# Patient Record
Sex: Male | Born: 1955 | Race: White | Hispanic: No | Marital: Married | Smoking: Former smoker
Health system: Southern US, Community
[De-identification: ages and names within clinical notes are randomized; demographics above are authoritative.]

## PROBLEM LIST (undated history)

## (undated) DIAGNOSIS — I10 Essential (primary) hypertension: Secondary | ICD-10-CM

## (undated) DIAGNOSIS — G252 Other specified forms of tremor: Secondary | ICD-10-CM

## (undated) DIAGNOSIS — Z9889 Other specified postprocedural states: Secondary | ICD-10-CM

## (undated) DIAGNOSIS — R112 Nausea with vomiting, unspecified: Secondary | ICD-10-CM

## (undated) DIAGNOSIS — G250 Essential tremor: Secondary | ICD-10-CM

## (undated) DIAGNOSIS — N433 Hydrocele, unspecified: Secondary | ICD-10-CM

## (undated) DIAGNOSIS — E785 Hyperlipidemia, unspecified: Secondary | ICD-10-CM

## (undated) DIAGNOSIS — C801 Malignant (primary) neoplasm, unspecified: Secondary | ICD-10-CM

## (undated) DIAGNOSIS — Z8719 Personal history of other diseases of the digestive system: Secondary | ICD-10-CM

## (undated) DIAGNOSIS — M1990 Unspecified osteoarthritis, unspecified site: Secondary | ICD-10-CM

## (undated) HISTORY — PX: HERNIA REPAIR: SHX51

## (undated) HISTORY — DX: Essential (primary) hypertension: I10

## (undated) HISTORY — DX: Hyperlipidemia, unspecified: E78.5

## (undated) HISTORY — PX: COLONOSCOPY: SHX174

## (undated) HISTORY — PX: TONSILLECTOMY: SUR1361

## (undated) HISTORY — DX: Essential tremor: G25.0

## (undated) HISTORY — DX: Other specified forms of tremor: G25.2

## (undated) HISTORY — PX: MOHS SURGERY: SUR867

---

## 1991-03-31 HISTORY — PX: INGUINAL HERNIA REPAIR: SHX194

## 2001-03-30 HISTORY — PX: INGUINAL HERNIA REPAIR: SHX194

## 2010-03-30 HISTORY — PX: SKIN CANCER EXCISION: SHX779

## 2011-04-10 ENCOUNTER — Other Ambulatory Visit (HOSPITAL_BASED_OUTPATIENT_CLINIC_OR_DEPARTMENT_OTHER): Payer: Self-pay | Admitting: Family Medicine

## 2011-04-10 DIAGNOSIS — R29898 Other symptoms and signs involving the musculoskeletal system: Secondary | ICD-10-CM

## 2011-04-11 ENCOUNTER — Ambulatory Visit (HOSPITAL_BASED_OUTPATIENT_CLINIC_OR_DEPARTMENT_OTHER)
Admission: RE | Admit: 2011-04-11 | Discharge: 2011-04-11 | Disposition: A | Discharge status: Home / Self Care | Payer: 59 | Source: Ambulatory Visit | Attending: Family Medicine | Admitting: Family Medicine

## 2011-04-11 DIAGNOSIS — M542 Cervicalgia: Secondary | ICD-10-CM | POA: Insufficient documentation

## 2011-04-11 DIAGNOSIS — M5020 Other cervical disc displacement, unspecified cervical region: Secondary | ICD-10-CM | POA: Insufficient documentation

## 2011-04-11 DIAGNOSIS — R29898 Other symptoms and signs involving the musculoskeletal system: Secondary | ICD-10-CM

## 2011-04-11 DIAGNOSIS — M25519 Pain in unspecified shoulder: Secondary | ICD-10-CM

## 2011-04-11 DIAGNOSIS — M6281 Muscle weakness (generalized): Secondary | ICD-10-CM | POA: Insufficient documentation

## 2011-04-14 ENCOUNTER — Encounter (HOSPITAL_COMMUNITY): Payer: Self-pay

## 2011-04-14 ENCOUNTER — Encounter (HOSPITAL_COMMUNITY)
Admission: RE | Admit: 2011-04-14 | Discharge: 2011-04-14 | Disposition: A | Discharge status: Home / Self Care | Payer: 59 | Source: Ambulatory Visit | Attending: Anesthesiology | Admitting: Anesthesiology

## 2011-04-14 ENCOUNTER — Other Ambulatory Visit: Payer: Self-pay

## 2011-04-14 ENCOUNTER — Encounter (HOSPITAL_COMMUNITY)
Admission: RE | Admit: 2011-04-14 | Discharge: 2011-04-14 | Disposition: A | Discharge status: Home / Self Care | Payer: 59 | Source: Ambulatory Visit | Attending: Neurosurgery | Admitting: Neurosurgery

## 2011-04-14 ENCOUNTER — Encounter (HOSPITAL_COMMUNITY): Payer: Self-pay | Admitting: Pharmacy Technician

## 2011-04-14 ENCOUNTER — Other Ambulatory Visit (HOSPITAL_COMMUNITY): Payer: Self-pay | Admitting: *Deleted

## 2011-04-14 HISTORY — DX: Other specified postprocedural states: Z98.890

## 2011-04-14 HISTORY — DX: Nausea with vomiting, unspecified: R11.2

## 2011-04-14 HISTORY — DX: Other specified postprocedural states: R11.2

## 2011-04-14 HISTORY — DX: Malignant (primary) neoplasm, unspecified: C80.1

## 2011-04-14 LAB — BASIC METABOLIC PANEL
BUN: 16 mg/dL (ref 6–23)
Chloride: 101 mEq/L (ref 96–112)
Creatinine, Ser: 0.9 mg/dL (ref 0.50–1.35)
GFR calc Af Amer: >90 mL/min (ref >90)
Glucose, Bld: 98 mg/dL (ref 70–99)

## 2011-04-14 LAB — CBC
HCT: 42 % (ref 39.0–52.0)
MCH: 28.8 pg (ref 26.0–34.0)
MCV: 84 fL (ref 78.0–100.0)
Platelets: 222 10*3/uL (ref 150–400)
RBC: 5 MIL/uL (ref 4.22–5.81)

## 2011-04-14 MED ORDER — CEFAZOLIN SODIUM 1-5 GM-% IV SOLN
1.0000 g | INTRAVENOUS | Status: AC
  Administered 2011-04-15: 1 g via INTRAVENOUS
  Filled 2011-04-14: qty 50

## 2011-04-14 MED ORDER — DEXAMETHASONE SODIUM PHOSPHATE 10 MG/ML IJ SOLN
10.0000 mg | Freq: Once | INTRAMUSCULAR | Status: AC
  Administered 2011-04-15: 10 mg via INTRAVENOUS
  Filled 2011-04-14: qty 1

## 2011-04-14 NOTE — Consult Note (Signed)
Anesthesia:  Patient is a 56 year old male scheduled for a four level ACDF on 04/15/11.  This PAT appointment was just today.  His history in significant for skin CA, former smoker (quit 30 years ago).  I was asked to see him during his PAT appointment because he reported a recent episode of chest pain.  His PCP is Dr. Joycelyn Rua who referred him to Dr. Wynetta Emery, and according to the patient is aware of the planned procedure. (He was just seen last week, but Dr. Titus Mould office does not have the final note ready to fax.)  He describes a single episode of chest tightness that lasted only seconds when he was on the phone with the insurance company today.  Pain was located mid to left side of chest.  He had no associated symptoms such as palpitations, nausea, diaphoresis, jaw, or arm pain.  He has not had that pain before of since.  He denies SOB and edema.  Just over Christmas break he was walking 4 miles per day at a steady pace.  He denies known history of CAD/MI or DM.  He does not have a history of HTN although his BP was elevated today at 158/98.  It was 136/80 at his last visit with Dr. Daphane Shepherd.  He denies family history of CAD.  Today's EKG showed NSR.  He has never had a stress or echo done.  His heart has a RRR, lungs clear, no LE edema or carotid bruits noted.  His BMI is 25.5.  Preoperative CXR and labs are unremarkable.   I reviewed above with Anesthesiologist Dr. Chaney Malling.  He agrees that chest pain today was somewhat atypical for cardiac etiology.  EKG is WNL, and he has had recent MET of at least 8.  If he has no further episodes, it is anticipated that he can proceed.  His assigned Anesthesiologist will evaluate him on the day of surgery.

## 2011-04-14 NOTE — Pre-Procedure Instructions (Signed)
20 AQUAN KOPE  04/14/2011   Your procedure is scheduled on:  January 16th.    Report to [facility] at 6:30 AM.   Call this number if you have problems the morning of surgery: [phone]   Remember:   Do not eat food:After Midnight.  May have clear liquids: up to 4 Hours before arrival. 2:30  Clear liquids include soda, tea, black coffee, apple or grape juice, broth.  Take these medicines the morning of surgery with A SIP OF WATER: none   Do not wear jewelry, make-up or nail polish.  Do not wear lotions, powders, or perfumes. You may wear deodorant.  Do not shave 48 hours prior to surgery.  Do not bring valuables to the hospital.  Contacts, dentures or bridgework may not be worn into surgery.  Leave suitcase in the car. After surgery it may be brought to your room.  For patients admitted to the hospital, checkout time is 11:00 AM the day of discharge.   Patients discharged the day of surgery will not be allowed to drive home.  Name and phone number of your driver:--  Special Instructions: CHG Shower Use Special Wash: 1/2 bottle night before surgery and 1/2 bottle morning of surgery.   Please read over the following fact sheets that you were given: Pain Booklet, Coughing and Deep Breathing, MRSA Information and Surgical Site Infection Prevention

## 2011-04-15 ENCOUNTER — Encounter (HOSPITAL_COMMUNITY): Payer: Self-pay | Admitting: Vascular Surgery

## 2011-04-15 ENCOUNTER — Encounter (HOSPITAL_COMMUNITY)
Admission: RE | Disposition: A | Discharge status: Home / Self Care | Payer: Self-pay | Source: Ambulatory Visit | Attending: Neurosurgery

## 2011-04-15 ENCOUNTER — Inpatient Hospital Stay (HOSPITAL_COMMUNITY): Payer: 59

## 2011-04-15 ENCOUNTER — Inpatient Hospital Stay (HOSPITAL_COMMUNITY)
Admission: RE | Admit: 2011-04-15 | Discharge: 2011-04-16 | DRG: 473 | Disposition: A | Discharge status: Home / Self Care | Payer: 59 | Source: Ambulatory Visit | Attending: Neurosurgery | Admitting: Neurosurgery

## 2011-04-15 ENCOUNTER — Inpatient Hospital Stay (HOSPITAL_COMMUNITY): Payer: 59 | Admitting: Vascular Surgery

## 2011-04-15 ENCOUNTER — Encounter (HOSPITAL_COMMUNITY): Payer: Self-pay | Admitting: *Deleted

## 2011-04-15 DIAGNOSIS — Z01812 Encounter for preprocedural laboratory examination: Secondary | ICD-10-CM

## 2011-04-15 DIAGNOSIS — M47812 Spondylosis without myelopathy or radiculopathy, cervical region: Principal | ICD-10-CM | POA: Diagnosis present

## 2011-04-15 HISTORY — PX: ANTERIOR CERVICAL DECOMP/DISCECTOMY FUSION: SHX1161

## 2011-04-15 LAB — CBC
MCV: 84 fL (ref 78.0–100.0)
Platelets: 218 10*3/uL (ref 150–400)
RBC: 4.57 MIL/uL (ref 4.22–5.81)
RDW: 13.4 % (ref 11.5–15.5)
WBC: 11.3 10*3/uL — ABNORMAL HIGH (ref 4.0–10.5)

## 2011-04-15 SURGERY — ANTERIOR CERVICAL DECOMPRESSION/DISCECTOMY FUSION 4 LEVELS
Anesthesia: General | Wound class: Clean

## 2011-04-15 MED ORDER — HYDROMORPHONE HCL PF 1 MG/ML IJ SOLN
INTRAMUSCULAR | Status: AC
  Filled 2011-04-15: qty 1

## 2011-04-15 MED ORDER — 0.9 % SODIUM CHLORIDE (POUR BTL) OPTIME
TOPICAL | Status: DC | PRN
  Administered 2011-04-15: 1000 mL

## 2011-04-15 MED ORDER — ONDANSETRON HCL 4 MG/2ML IJ SOLN
INTRAMUSCULAR | Status: DC | PRN
  Administered 2011-04-15: 4 mg via INTRAVENOUS

## 2011-04-15 MED ORDER — HYDROMORPHONE HCL PF 1 MG/ML IJ SOLN
0.5000 mg | INTRAMUSCULAR | Status: AC
  Administered 2011-04-15 (×2): 0.5 mg via INTRAVENOUS

## 2011-04-15 MED ORDER — LACTATED RINGERS IV SOLN
INTRAVENOUS | Status: DC | PRN
  Administered 2011-04-15 (×3): via INTRAVENOUS

## 2011-04-15 MED ORDER — MIDAZOLAM HCL 5 MG/5ML IJ SOLN
INTRAMUSCULAR | Status: DC | PRN
  Administered 2011-04-15: 2 mg via INTRAVENOUS

## 2011-04-15 MED ORDER — GLYCOPYRROLATE 0.2 MG/ML IJ SOLN
INTRAMUSCULAR | Status: DC | PRN
  Administered 2011-04-15: .4 mg via INTRAVENOUS

## 2011-04-15 MED ORDER — CYCLOBENZAPRINE HCL 10 MG PO TABS
ORAL_TABLET | ORAL | Status: AC
  Filled 2011-04-15: qty 1

## 2011-04-15 MED ORDER — HYDROMORPHONE HCL PF 1 MG/ML IJ SOLN: 0.5000 mg | INTRAMUSCULAR | Status: DC | PRN

## 2011-04-15 MED ORDER — POLYVINYL ALCOHOL 1.4 % OP SOLN
1.0000 [drp] | OPHTHALMIC | Status: DC | PRN
  Filled 2011-04-15: qty 15

## 2011-04-15 MED ORDER — FENTANYL CITRATE 0.05 MG/ML IJ SOLN
INTRAMUSCULAR | Status: DC | PRN
  Administered 2011-04-15 (×2): 50 ug via INTRAVENOUS
  Administered 2011-04-15: 150 ug via INTRAVENOUS

## 2011-04-15 MED ORDER — ONDANSETRON HCL 4 MG/2ML IJ SOLN: 4.0000 mg | INTRAMUSCULAR | Status: DC | PRN

## 2011-04-15 MED ORDER — VECURONIUM BROMIDE 10 MG IV SOLR
INTRAVENOUS | Status: DC | PRN
  Administered 2011-04-15 (×2): 2 mg via INTRAVENOUS

## 2011-04-15 MED ORDER — SODIUM CHLORIDE 0.9 % IR SOLN
Status: DC | PRN
  Administered 2011-04-15: 09:00:00

## 2011-04-15 MED ORDER — MENTHOL 3 MG MT LOZG
1.0000 | LOZENGE | OROMUCOSAL | Status: DC | PRN
  Filled 2011-04-15: qty 9

## 2011-04-15 MED ORDER — CYCLOBENZAPRINE HCL 10 MG PO TABS
10.0000 mg | ORAL_TABLET | Freq: Three times a day (TID) | ORAL | Status: DC | PRN
  Administered 2011-04-15 – 2011-04-16 (×3): 10 mg via ORAL
  Filled 2011-04-15 (×2): qty 1

## 2011-04-15 MED ORDER — SODIUM CHLORIDE 0.9 % IV SOLN
INTRAVENOUS | Status: AC
  Filled 2011-04-15: qty 500

## 2011-04-15 MED ORDER — PROPOFOL 10 MG/ML IV EMUL
INTRAVENOUS | Status: DC | PRN
  Administered 2011-04-15: 200 mg via INTRAVENOUS

## 2011-04-15 MED ORDER — CEFAZOLIN SODIUM 1-5 GM-% IV SOLN
1.0000 g | Freq: Three times a day (TID) | INTRAVENOUS | Status: AC
Start: 2011-04-15 — End: 2011-04-16
  Administered 2011-04-15 – 2011-04-16 (×2): 1 g via INTRAVENOUS
  Filled 2011-04-15 (×2): qty 50

## 2011-04-15 MED ORDER — THROMBIN 20000 UNITS EX KIT
PACK | CUTANEOUS | Status: DC | PRN
  Administered 2011-04-15: 09:00:00 via TOPICAL

## 2011-04-15 MED ORDER — ACETAMINOPHEN 650 MG RE SUPP: 650.0000 mg | RECTAL | Status: DC | PRN

## 2011-04-15 MED ORDER — THROMBIN 5000 UNITS EX SOLR
OROMUCOSAL | Status: DC | PRN
  Administered 2011-04-15: 10:00:00 via TOPICAL

## 2011-04-15 MED ORDER — NEOSTIGMINE METHYLSULFATE 1 MG/ML IJ SOLN
INTRAMUSCULAR | Status: DC | PRN
  Administered 2011-04-15: 3 mg via INTRAVENOUS

## 2011-04-15 MED ORDER — HYDROMORPHONE HCL PF 1 MG/ML IJ SOLN
0.2500 mg | INTRAMUSCULAR | Status: DC | PRN
  Administered 2011-04-15 (×4): 0.5 mg via INTRAVENOUS

## 2011-04-15 MED ORDER — OXYCODONE-ACETAMINOPHEN 5-325 MG PO TABS
1.0000 | ORAL_TABLET | ORAL | Status: DC | PRN
  Administered 2011-04-15 (×2): 2 via ORAL
  Administered 2011-04-16 (×2): 1 via ORAL
  Administered 2011-04-16: 2 via ORAL
  Administered 2011-04-16: 1 via ORAL
  Filled 2011-04-15: qty 1
  Filled 2011-04-15 (×2): qty 2
  Filled 2011-04-15 (×2): qty 1

## 2011-04-15 MED ORDER — ROCURONIUM BROMIDE 100 MG/10ML IV SOLN
INTRAVENOUS | Status: DC | PRN
  Administered 2011-04-15: 50 mg via INTRAVENOUS

## 2011-04-15 MED ORDER — PHENOL 1.4 % MT LIQD: 1.0000 | OROMUCOSAL | Status: DC | PRN

## 2011-04-15 MED ORDER — ACETAMINOPHEN 325 MG PO TABS: 650.0000 mg | ORAL_TABLET | ORAL | Status: DC | PRN

## 2011-04-15 MED ORDER — DROPERIDOL 2.5 MG/ML IJ SOLN
0.6250 mg | INTRAMUSCULAR | Status: DC | PRN
  Administered 2011-04-15: 0.625 mg via INTRAVENOUS

## 2011-04-15 MED ORDER — BACITRACIN 50000 UNITS IM SOLR
INTRAMUSCULAR | Status: AC
  Filled 2011-04-15: qty 1

## 2011-04-15 MED ORDER — PANTOPRAZOLE SODIUM 40 MG IV SOLR
40.0000 mg | Freq: Every day | INTRAVENOUS | Status: DC
  Administered 2011-04-15: 40 mg via INTRAVENOUS
  Filled 2011-04-15 (×2): qty 40

## 2011-04-15 MED ORDER — SODIUM CHLORIDE 0.9 % IJ SOLN
3.0000 mL | Freq: Two times a day (BID) | INTRAMUSCULAR | Status: DC
  Administered 2011-04-15: 3 mL via INTRAVENOUS

## 2011-04-15 MED ORDER — OXYCODONE-ACETAMINOPHEN 5-325 MG PO TABS
ORAL_TABLET | ORAL | Status: AC
  Filled 2011-04-15: qty 2

## 2011-04-15 SURGICAL SUPPLY — 80 items
BAG DECANTER FOR FLEXI CONT (MISCELLANEOUS) ×2 IMPLANT
BENZOIN TINCTURE PRP APPL 2/3 (GAUZE/BANDAGES/DRESSINGS) ×2 IMPLANT
BONE CERV LORDOTIC 14.5X12X6 (Bone Implant) ×2 IMPLANT
BONE CERV LORDOTIC 14.5X12X7 (Bone Implant) ×6 IMPLANT
BRUSH SCRUB EZ PLAIN DRY (MISCELLANEOUS) ×2 IMPLANT
BUR MATCHSTICK NEURO 3.0 LAGG (BURR) ×2 IMPLANT
CANISTER SUCTION 2500CC (MISCELLANEOUS) ×2 IMPLANT
CLOSURE STERI STRIP 1/2 X4 (GAUZE/BANDAGES/DRESSINGS) ×2 IMPLANT
CLOTH BEACON ORANGE TIMEOUT ST (SAFETY) ×2 IMPLANT
CONT SPEC 4OZ CLIKSEAL STRL BL (MISCELLANEOUS) ×2 IMPLANT
DECANTER SPIKE VIAL GLASS SM (MISCELLANEOUS) ×2 IMPLANT
DERMABOND ADVANCED (GAUZE/BANDAGES/DRESSINGS) ×1
DERMABOND ADVANCED .7 DNX12 (GAUZE/BANDAGES/DRESSINGS) ×1 IMPLANT
DRAIN SNY WOU 7FLT (WOUND CARE) ×2 IMPLANT
DRAPE C-ARM 42X72 X-RAY (DRAPES) ×4 IMPLANT
DRAPE LAPAROTOMY 100X72 PEDS (DRAPES) ×2 IMPLANT
DRAPE MICROSCOPE ZEISS OPMI (DRAPES) ×2 IMPLANT
DRAPE POUCH INSTRU U-SHP 10X18 (DRAPES) ×2 IMPLANT
DRILL BIT (BIT) ×2 IMPLANT
DRSG OPSITE 4X5.5 SM (GAUZE/BANDAGES/DRESSINGS) ×2 IMPLANT
ELECT COATED BLADE 2.86 ST (ELECTRODE) ×2 IMPLANT
ELECT REM PT RETURN 9FT ADLT (ELECTROSURGICAL) ×2
ELECTRODE REM PT RTRN 9FT ADLT (ELECTROSURGICAL) ×1 IMPLANT
EVACUATOR SILICONE 100CC (DRAIN) ×2 IMPLANT
GAUZE SPONGE 4X4 12PLY STRL LF (GAUZE/BANDAGES/DRESSINGS) ×2 IMPLANT
GAUZE SPONGE 4X4 16PLY XRAY LF (GAUZE/BANDAGES/DRESSINGS) ×4 IMPLANT
GLOVE BIO SURGEON STRL SZ 6.5 (GLOVE) IMPLANT
GLOVE BIO SURGEON STRL SZ7 (GLOVE) IMPLANT
GLOVE BIO SURGEON STRL SZ7.5 (GLOVE) IMPLANT
GLOVE BIO SURGEON STRL SZ8 (GLOVE) ×4 IMPLANT
GLOVE BIO SURGEON STRL SZ8.5 (GLOVE) IMPLANT
GLOVE BIOGEL M 8.0 STRL (GLOVE) IMPLANT
GLOVE BIOGEL PI IND STRL 7.0 (GLOVE) ×1 IMPLANT
GLOVE BIOGEL PI INDICATOR 7.0 (GLOVE) ×1
GLOVE ECLIPSE 6.5 STRL STRAW (GLOVE) IMPLANT
GLOVE ECLIPSE 7.0 STRL STRAW (GLOVE) IMPLANT
GLOVE ECLIPSE 7.5 STRL STRAW (GLOVE) ×4 IMPLANT
GLOVE ECLIPSE 8.0 STRL XLNG CF (GLOVE) IMPLANT
GLOVE ECLIPSE 8.5 STRL (GLOVE) IMPLANT
GLOVE EXAM NITRILE LRG STRL (GLOVE) IMPLANT
GLOVE EXAM NITRILE MD LF STRL (GLOVE) IMPLANT
GLOVE EXAM NITRILE XL STR (GLOVE) IMPLANT
GLOVE EXAM NITRILE XS STR PU (GLOVE) IMPLANT
GLOVE INDICATOR 6.5 STRL GRN (GLOVE) IMPLANT
GLOVE INDICATOR 7.0 STRL GRN (GLOVE) IMPLANT
GLOVE INDICATOR 7.5 STRL GRN (GLOVE) ×2 IMPLANT
GLOVE INDICATOR 8.0 STRL GRN (GLOVE) IMPLANT
GLOVE INDICATOR 8.5 STRL (GLOVE) ×2 IMPLANT
GLOVE OPTIFIT SS 8.0 STRL (GLOVE) IMPLANT
GLOVE SURG SS PI 6.5 STRL IVOR (GLOVE) ×8 IMPLANT
GOWN BRE IMP SLV AUR LG STRL (GOWN DISPOSABLE) ×2 IMPLANT
GOWN BRE IMP SLV AUR XL STRL (GOWN DISPOSABLE) ×4 IMPLANT
GOWN STRL REIN 2XL LVL4 (GOWN DISPOSABLE) IMPLANT
HEAD HALTER (SOFTGOODS) ×2 IMPLANT
HEMOSTAT POWDER KIT SURGIFOAM (HEMOSTASIS) ×4 IMPLANT
KIT BASIN OR (CUSTOM PROCEDURE TRAY) ×2 IMPLANT
KIT ROOM TURNOVER OR (KITS) ×2 IMPLANT
NEEDLE HYPO 18GX1.5 BLUNT FILL (NEEDLE) ×2 IMPLANT
NEEDLE SPNL 20GX3.5 QUINCKE YW (NEEDLE) ×2 IMPLANT
NS IRRIG 1000ML POUR BTL (IV SOLUTION) ×2 IMPLANT
PACK LAMINECTOMY NEURO (CUSTOM PROCEDURE TRAY) ×2 IMPLANT
PAD ARMBOARD 7.5X6 YLW CONV (MISCELLANEOUS) ×6 IMPLANT
PLATE 4 75XNS SPNE CVD ANT T (Plate) ×1 IMPLANT
PLATE 4 ATLANTIS TRANS (Plate) ×1 IMPLANT
RUBBERBAND STERILE (MISCELLANEOUS) ×4 IMPLANT
SCREW ST FIX 4 ATL 3120213 (Screw) ×26 IMPLANT
SPONGE GAUZE 4X4 12PLY (GAUZE/BANDAGES/DRESSINGS) ×2 IMPLANT
SPONGE INTESTINAL PEANUT (DISPOSABLE) ×4 IMPLANT
SPONGE SURGIFOAM ABS GEL SZ50 (HEMOSTASIS) ×2 IMPLANT
STRIP CLOSURE SKIN 1/2X4 (GAUZE/BANDAGES/DRESSINGS) ×2 IMPLANT
SUT SILK 2 0 TIES 10X30 (SUTURE) ×2 IMPLANT
SUT VIC AB 3-0 SH 8-18 (SUTURE) ×2 IMPLANT
SUT VICRYL 4-0 PS2 18IN ABS (SUTURE) ×2 IMPLANT
SYR 20ML ECCENTRIC (SYRINGE) ×2 IMPLANT
TAPE CLOTH 4X10 WHT NS (GAUZE/BANDAGES/DRESSINGS) IMPLANT
TAPE CLOTH SURG 4X10 WHT LF (GAUZE/BANDAGES/DRESSINGS) ×2 IMPLANT
TOWEL OR 17X24 6PK STRL BLUE (TOWEL DISPOSABLE) ×2 IMPLANT
TOWEL OR 17X26 10 PK STRL BLUE (TOWEL DISPOSABLE) ×2 IMPLANT
TRAP SPECIMEN MUCOUS 40CC (MISCELLANEOUS) ×2 IMPLANT
WATER STERILE IRR 1000ML POUR (IV SOLUTION) ×2 IMPLANT

## 2011-04-15 NOTE — Transfer of Care (Signed)
Immediate Anesthesia Transfer of Care Note  Patient: Henry Kennedy  Procedure(s) Performed:  ANTERIOR CERVICAL DECOMPRESSION/DISCECTOMY FUSION 4 LEVELS - Ceanterior cervical discectomy and fusion, cervical three-four, cervical four-five, cervical five-six, cervical six-seven  Patient Location: PACU  Anesthesia Type: General  Level of Consciousness: awake, alert , oriented and patient cooperative  Airway & Oxygen Therapy: Patient Spontanous Breathing and Patient connected to nasal cannula oxygen  Post-op Assessment: Report given to PACU RN, Post -op Vital signs reviewed and stable and Patient moving all extremities  Post vital signs: Reviewed and stable Filed Vitals:   04/15/11 0651  BP: 135/82  Pulse: 68  Temp: 36.6 C  Resp: 18    Complications: No apparent anesthesia complications

## 2011-04-15 NOTE — Preoperative (Signed)
Beta Blockers   Reason not to administer Beta Blockers:Not Applicable 

## 2011-04-15 NOTE — H&P (Signed)
Henry Kennedy is an 56 y.o. male.   Chief Complaint: Neck pain weakness left shoulder HPI: Patient is a 56 year old gentleman who last Thursday experienced acute onset weakness of his left shoulder took some anti-inflammatories and saw his medical doctor an MRI scan was obtained and was referred to me for evaluation. Patient complains of pain in his left neck into his left shoulder and down to his left bicep case and into the outside of his left hand with numbness tingling same distribution. It has not been significantly impacted with prednisone denies any right arm symptoms denies any difficulty walking or changes in his gait denies any bowel bladder complaints. Patient does not know any particular inciting event although he has been fairly active in last few weeks over the holidays moving furniture.  Past Medical History  Diagnosis Date  . PONV (postoperative nausea and vomiting)   . Cancer     skin  Basal  and squamous    Past Surgical History  Procedure Date  . Inguinal hernia repair 2003    Right  . Inguinal hernia repair 1993    left  . Tonsillectomy   . Skin cancer excision 2012    Nose    History reviewed. No pertinent family history. Social History:  reports that he has quit smoking. He does not have any smokeless tobacco history on file. He reports that he drinks about 1.2 ounces of alcohol per week. His drug history not on file.  Allergies: No Known Allergies  Medications Prior to Admission  Medication Dose Route Frequency Provider Last Rate Last Dose  . bacitracin 16109 UNITS injection           . ceFAZolin (ANCEF) IVPB 1 g/50 mL premix  1 g Intravenous 60 min Pre-Op Mariam Dollar      . dexamethasone (DECADRON) injection 10 mg  10 mg Intravenous Once Mariam Dollar      . sodium chloride 0.9 % infusion            Medications Prior to Admission  Medication Sig Dispense Refill  . predniSONE (DELTASONE) 10 MG tablet Take 10 mg by mouth daily. Taking a tapering dose for 9  days and on 04-13-10 he is on the 5 th day        Results for orders placed during the hospital encounter of 04/14/11 (from the past 48 hour(s))  BASIC METABOLIC PANEL     Status: Normal   Collection Time   04/14/11  2:07 PM      Component Value Range Comment   Sodium 143  135 - 145 (mEq/L)    Potassium 3.6  3.5 - 5.1 (mEq/L)    Chloride 101  96 - 112 (mEq/L)    CO2 31  19 - 32 (mEq/L)    Glucose, Bld 98  70 - 99 (mg/dL)    BUN 16  6 - 23 (mg/dL)    Creatinine, Ser 6.04  0.50 - 1.35 (mg/dL)    Calcium 9.6  8.4 - 10.5 (mg/dL)    GFR calc non Af Amer >90  >90 (mL/min)    GFR calc Af Amer >90  >90 (mL/min)   SURGICAL PCR SCREEN     Status: Normal   Collection Time   04/14/11  2:07 PM      Component Value Range Comment   MRSA, PCR NEGATIVE  NEGATIVE     Staphylococcus aureus NEGATIVE  NEGATIVE    CBC     Status: Normal  Collection Time   04/14/11  2:07 PM      Component Value Range Comment   WBC 7.4  4.0 - 10.5 (K/uL)    RBC 5.00  4.22 - 5.81 (MIL/uL)    Hemoglobin 14.4  13.0 - 17.0 (g/dL)    HCT 16.1  09.6 - 04.5 (%)    MCV 84.0  78.0 - 100.0 (fL)    MCH 28.8  26.0 - 34.0 (pg)    MCHC 34.3  30.0 - 36.0 (g/dL)    RDW 40.9  81.1 - 91.4 (%)    Platelets 222  150 - 400 (K/uL)    Dg Chest 2 View  04/14/2011  *RADIOLOGY REPORT*  Clinical Data: Preoperative respiratory exam.  Chest pain. Cervical disc disease.  CHEST - 2 VIEW  Comparison: None.  Findings: Heart size and vascularity are normal and the lungs are clear.  No acute osseous abnormality.  IMPRESSION: No acute disease in the chest.  Original Report Authenticated By: Gwynn Burly, M.D.    Review of Systems  HENT: Positive for neck pain.   Eyes: Negative.   Respiratory: Negative.   Cardiovascular: Negative.   Gastrointestinal: Negative.   Musculoskeletal: Positive for myalgias and joint pain.  Neurological: Positive for tingling, focal weakness, weakness and headaches.    Blood pressure 135/82, pulse 68, temperature  97.9 F (36.6 C), temperature source Oral, resp. rate 18, SpO2 96.00%. Physical Exam  Constitutional: He is oriented to person, place, and time. He appears well-developed and well-nourished.  HENT:  Head: Normocephalic and atraumatic.  Eyes: Pupils are equal, round, and reactive to light.  Neck: Normal range of motion.  Respiratory: Breath sounds normal.  GI: Soft.  Neurological: He is alert and oriented to person, place, and time. GCS eye subscore is 4. GCS verbal subscore is 5. GCS motor subscore is 6.  Reflex Scores:      Bicep reflexes are 1+ on the right side and 1+ on the left side.      Brachioradialis reflexes are 1+ on the right side and 1+ on the left side.      Patellar reflexes are 2+ on the right side and 2+ on the left side.      Achilles reflexes are 2+ on the right side and 2+ on the left side.      Right upper extremity patient strength is 5 out of 5 his deltoids biceps triceps wrist flexion extension and intrinsics. Left upper extremity has weakness in his deltoids and about 2/5 biceps 4+ out of 5 in distally is 5 out of 5     Assessment/Plan 56 year old gentleman with an acute C5 radiculopathy and significant left deltoid weakness in addition he has symptoms of radiculopathy in both C6 and C7. MRI scan showed significant spinal cord compression spondylosis and herniated nucleus pulposus at C3-4, C4-5, C5-6, and C6-7. Have extensively explained the risks and benefits, and perioperative course, alternatives to surgery, expectations of outcome the patient he understands and agrees to proceed forward.  Seham Gardenhire P 04/15/2011, 8:18 AM

## 2011-04-15 NOTE — Anesthesia Preprocedure Evaluation (Addendum)
Anesthesia Evaluation  Patient identified by MRN, date of birth, ID band Patient awake    Reviewed: Allergy & Precautions, H&P , NPO status , Patient's Chart, lab work & pertinent test results, reviewed documented beta blocker date and time   History of Anesthesia Complications (+) PONV  Airway Mallampati: I TM Distance: >3 FB Neck ROM: Full    Dental  (+) Teeth Intact   Pulmonary neg pulmonary ROS,  clear to auscultation  Pulmonary exam normal       Cardiovascular neg cardio ROS Regular Normal- Systolic murmurs    Neuro/Psych    GI/Hepatic negative GI ROS, Neg liver ROS,   Endo/Other  Negative Endocrine ROS  Renal/GU negative Renal ROS     Musculoskeletal   Abdominal   Peds  Hematology   Anesthesia Other Findings   Reproductive/Obstetrics                          Anesthesia Physical Anesthesia Plan  ASA: II  Anesthesia Plan: General   Post-op Pain Management:    Induction: Intravenous  Airway Management Planned: Oral ETT  Additional Equipment:   Intra-op Plan:   Post-operative Plan: Extubation in OR  Informed Consent:   Plan Discussed with:   Anesthesia Plan Comments:         Anesthesia Quick Evaluation

## 2011-04-15 NOTE — Op Note (Signed)
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  Surgeon: Jillyn Hidden Musab Wingard  Assistant: Marikay Alar  Anesthesia: Gen.  EBL: Minimal history of present illness  History of present illness: Patient is a 56 year old gentleman who put at present with acute onset left arm weakness in his deltoids refractory to anti-inflammatories prednisone workup revealed severe cervical spondylosis spinal cord compression at all 4 levels with severe compression at C5-C6 nerve root consistent with his presentation. Due to the degree of spinal cord compression at all 4 levels as recommended a 4 level anterior cervical discectomy and fusion. Risks benefits of the operation as well as perioperative course and expectations of outcome and alternatives surgery were also patient he understands and agreed to proceed forward.  Operative procedure: Patient was brought into the or was induced under general anesthesia and positioned supine the neck in slight extension in 5 pounds of halter traction. The right side his neck was prepped and draped in routine sterile fashion and preoperative x-ray localize the appropriate level so a curvilinear incision was made just off midline to the anterior border of the the sternomastoid. The platysmas and divided longitudinally and the avascular tissue sternomastoid and strap muscles was developed down to the prevertebral fascia and the prevertebral fascia facet of [place]. Interoperative X. identify the appropriate level so annulotomy is were made in the C3-4 and C4-5 disc space initially at the longus close was reflected laterally and self-retaining retractors placed the. The C3-4 and cervical disc spaces were then drilled down the posterior annulus  nausea complex under MAC scopic illumination first working at C4-5 aggressive and viable template helped remove a large spur coming off the C4 vertebral body margin across laterally to C5 pedicles were identified in the left C5 nerve root was identified it is a very large free fragment disc that was teased out of the foramen with the nerve hook in the left C5 nerve root. After adequate decompression as there was achieved and taken the right side right C5 nerve root was adequately decompressed in place and scraped. Then the C3-4 disc spaces to that a similar fashion as a large posterior spur coming off the C3 vertebral body aggressively and bitten decompress the central canal and both C4 nerve roots. 27 mm grafts were then placed at C3-4 and C4-5 and the retractor was repositioned to C5-6 and C6-7. In a similar fashion both the space a drill down the posterior annuluscomplex. At C6-7 he was a large posterior spur coming off the C6 vertebral body displacing the right side of spinal cord is all aggressively under been removed decompress the central canal and both C7 nerve roots were unroofed decompressed out the foramen. Then at diffuse spondylosis and bilateral facet hypertrophy displacing both C6 nerve roots this is all aggressively under been removed in piecemeal fashion.  After adequate abutting of the C5 and C6 endplates the central canal was decompressed and both C6 nerve roots were decompressed. Then 2 grafts were placed in these levels as well with a 6 mg C6-7-1 7 mm C5-6 at this point the operative exit was removed and the Atlantis translational plate was size all screws were drilled all screws excellent purchase locking mechanisms were engaged and a posterior fluoroscopy confirmed good position of plate screws and bone graft. The was in to proceed  her get meticulous hemostasis was maintained a drain was placed and the wounds closed in layers with after Vicryl and a running 4 subcuticular and Steri-Strips  applied patient recovered in stable condition.

## 2011-04-15 NOTE — Anesthesia Postprocedure Evaluation (Signed)
Anesthesia Post Note  Patient: Henry Kennedy  Procedure(s) Performed:  ANTERIOR CERVICAL DECOMPRESSION/DISCECTOMY FUSION 4 LEVELS - Ceanterior cervical discectomy and fusion, cervical three-four, cervical four-five, cervical five-six, cervical six-seven  Anesthesia type: general  Patient location: PACU  Post pain: Pain level controlled  Post assessment: Patient's Cardiovascular Status Stable  Last Vitals:  Filed Vitals:   04/15/11 1352  BP: 128/70  Pulse: 61  Temp:   Resp:     Post vital signs: Reviewed and stable  Level of consciousness: sedated  Complications: No apparent anesthesia complications

## 2011-04-16 MED ORDER — OXYCODONE-ACETAMINOPHEN 5-325 MG PO TABS: 1.0000 | ORAL_TABLET | ORAL | Status: AC | PRN

## 2011-04-16 MED ORDER — CYCLOBENZAPRINE HCL 10 MG PO TABS: 10.0000 mg | ORAL_TABLET | Freq: Three times a day (TID) | ORAL | Status: AC | PRN

## 2011-04-16 MED ORDER — TAMSULOSIN HCL 0.4 MG PO CAPS
0.4000 mg | ORAL_CAPSULE | Freq: Every day | ORAL | Status: DC
  Administered 2011-04-16: 0.4 mg via ORAL
  Filled 2011-04-16: qty 1

## 2011-04-16 NOTE — Discharge Summary (Signed)
Physician Discharge Summary  Patient ID: Henry Kennedy MRN: [id number] DOB/AGE: [birth year] 56 y.o.  Admit date: 04/15/2011 Discharge date: 04/16/2011  Admission Diagnoses: Cervical spondylosis with radiculopathy and stenosis at C3-4, C4-5, C5-6, and C6-7  Discharge Diagnoses: Same Active Problems:  * No active hospital problems. *    Discharged Condition: good  Hospital Course: Patient was admitted hospital yesterday underwent an ACDF at all 4 levels postoperatively patient did very well went to recovery room and the floor on the floor he has been doing well he's had some difficulty voiding or and initiate Flomax today and see other dose throughout the day and potential discharged later this afternoon.  Consults: none  Significant Diagnostic Studies:   Treatments: surgery: ACDF at C3-4 C4-5 C5-6 and C6-7  Discharge Exam: Blood pressure 136/82, pulse 70, temperature 98.8 F (37.1 C), temperature source Oral, resp. rate 18, height 5\' 10"  (1.778 m), weight 84.142 kg (185 lb 8 oz), SpO2 99.00%. Deltoid weakness at about 2-3/5 otherwise he is 5 out of 5 wound is clean and dry  Disposition: Final discharge disposition not confirmed   Medication List  As of 04/16/2011  9:17 AM   ASK your doctor about these medications         predniSONE 10 MG tablet   Commonly known as: DELTASONE   Take 10 mg by mouth daily. Taking a tapering dose for 9 days and on 04-13-10 he is on the 5 th day           Follow-up Information    Follow up in 1 week.         Signed: Serenity Batley P 04/16/2011, 9:17 AM

## 2011-04-16 NOTE — Progress Notes (Signed)
Subjective: Patient reports Cross and was having the numbness and tingling in his arms or his hands but this is improving and swallowing is manageable  Objective: Vital signs in last 24 hours: Temp:  [97.1 F (36.2 C)-98.8 F (37.1 C)] 98.8 F (37.1 C) (01/17 0602) Pulse Rate:  [58-92] 70  (01/17 0602) Resp:  [13-18] 18  (01/17 0602) BP: (128-164)/(70-97) 136/82 mmHg (01/17 0602) SpO2:  [94 %-100 %] 99 % (01/17 0602) Weight:  [84.142 kg (185 lb 8 oz)] 84.142 kg (185 lb 8 oz) (01/16 1537)  Intake/Output from previous day: 01/16 0701 - 01/17 0700 In: 3133 [P.O.:920; I.V.:2103; IV Piggyback:110] Out: 3375 [Urine:3190; Drains:85; Blood:100] Intake/Output this shift: Total I/O In: 240 [P.O.:240] Out: 100 [Urine:100]  Strength is 5 out of 5 in her upper extremities to have significant left deltoid weakness seen to be some improvement improved postoperatively  Lab Results:  Basename 04/15/11 1746 04/14/11 1407  WBC 11.3* 7.4  HGB 13.2 14.4  HCT 38.4* 42.0  PLT 218 222   BMET  Basename 04/14/11 1407  NA 143  K 3.6  CL 101  CO2 31  GLUCOSE 98  BUN 16  CREATININE 0.90  CALCIUM 9.6    Studies/Results: Dg Chest 2 View  04/14/2011  *RADIOLOGY REPORT*  Clinical Data: Preoperative respiratory exam.  Chest pain. Cervical disc disease.  CHEST - 2 VIEW  Comparison: None.  Findings: Heart size and vascularity are normal and the lungs are clear.  No acute osseous abnormality.  IMPRESSION: No acute disease in the chest.  Original Report Authenticated By: Gwynn Burly, M.D.   Dg Cervical Spine 2-3 Views  04/15/2011  *RADIOLOGY REPORT*  Clinical Data: C3-C7 ACDF.  CERVICAL SPINE - 2-3 VIEW  Comparison: Cervical MRI 04/11/2011.  Findings: Two cross-table lateral spot fluoroscopic images of the cervical spine are submitted.  These demonstrate anterior cervical discectomy and fusion extending inferiorly from C3.  The inferior extent of the hardware is not well seen on the first view.   Based on the location of the anterior surgical sponges, the hardware appears to extend inferiorly to C7 on the second view.  Inferior bone detail is limited.  No complications are identified.  IMPRESSION: Intraoperative views following C3-C7 ACDF.  No demonstrated complication.  Original Report Authenticated By: Gerrianne Scale, M.D.    Assessment/Plan: [age] posterior day 1 from an ACDF at C3-4 [phone] is doing very well ambulating and voiding although he may not be emptying his bladder completely he has had bladder scans that showed residual liters had been in and out cath twice s this is a chronic problem for him. We'll start him on Flomax this morning will observe throughout the day he does bladder start working will discharge him  LOS: 1 day     Khaliq Turay P 04/16/2011, 9:14 AM

## 2011-04-16 NOTE — Progress Notes (Signed)
Pt d/c in stable condition. Pt unable to void and a foley was inserted and leg bag attached. MD notified and stated that office would call patient with follow up appointment dates with urologist and neurosurgeon.

## 2011-04-16 NOTE — Progress Notes (Signed)
Utilization review completed. Adreyan Carbajal, RN, BSN. 04/16/11  

## 2011-04-16 NOTE — Progress Notes (Signed)
Patient's foley was discontinued at 1800. Patient wasn't able to void until 1230. Bladder scan was done and the result was  >999 ml. In and Out Cath done and was able to drain 1150 ml of urine. Will continue to monitor pt.

## 2011-05-14 ENCOUNTER — Other Ambulatory Visit: Payer: Self-pay | Admitting: Neurosurgery

## 2011-05-14 ENCOUNTER — Ambulatory Visit
Admission: RE | Admit: 2011-05-14 | Discharge: 2011-05-14 | Disposition: A | Discharge status: Home / Self Care | Payer: 59 | Source: Ambulatory Visit | Attending: Neurosurgery | Admitting: Neurosurgery

## 2011-05-14 DIAGNOSIS — M542 Cervicalgia: Secondary | ICD-10-CM

## 2011-06-23 ENCOUNTER — Other Ambulatory Visit: Payer: Self-pay | Admitting: Neurosurgery

## 2011-06-23 ENCOUNTER — Ambulatory Visit
Admission: RE | Admit: 2011-06-23 | Discharge: 2011-06-23 | Disposition: A | Discharge status: Home / Self Care | Payer: 59 | Source: Ambulatory Visit | Attending: Neurosurgery | Admitting: Neurosurgery

## 2011-06-23 DIAGNOSIS — M542 Cervicalgia: Secondary | ICD-10-CM

## 2012-02-12 ENCOUNTER — Encounter (INDEPENDENT_AMBULATORY_CARE_PROVIDER_SITE_OTHER): Payer: Self-pay

## 2012-02-15 ENCOUNTER — Encounter (INDEPENDENT_AMBULATORY_CARE_PROVIDER_SITE_OTHER): Payer: Self-pay | Admitting: General Surgery

## 2012-02-15 ENCOUNTER — Ambulatory Visit (INDEPENDENT_AMBULATORY_CARE_PROVIDER_SITE_OTHER): Payer: 59 | Admitting: General Surgery

## 2012-02-15 VITALS — BP 134/86 | HR 63 | Temp 98.0°F | Ht 70.0 in | Wt 180.2 lb

## 2012-02-15 DIAGNOSIS — R339 Retention of urine, unspecified: Secondary | ICD-10-CM

## 2012-02-15 DIAGNOSIS — K4091 Unilateral inguinal hernia, without obstruction or gangrene, recurrent: Secondary | ICD-10-CM

## 2012-02-15 NOTE — Patient Instructions (Signed)
[facility] _______[facility]   INGUINAL HERNIA REPAIR: POST OP INSTRUCTIONS  Always review your discharge instruction sheet given to you by the facility where your surgery was performed. IF YOU HAVE DISABILITY OR FAMILY LEAVE FORMS, YOU MUST BRING THEM TO THE OFFICE FOR PROCESSING.   DO NOT GIVE THEM TO YOUR DOCTOR.  1. A  prescription for pain medication may be given to you upon discharge.  Take your pain medication as prescribed, if needed.  If narcotic pain medicine is not needed, then you may take acetaminophen (Tylenol) or ibuprofen (Advil) as needed. 2. Take your usually prescribed medications unless otherwise directed. 3. If you need a refill on your pain medication, please contact your pharmacy.  They will contact our office to request authorization. Prescriptions will not be filled after 5 pm or on week-ends. 4. You should follow a light diet the first 24 hours after arrival home, such as soup and crackers, etc.  Be sure to include lots of fluids daily.  Resume your normal diet the day after surgery. 5. Most patients will experience some swelling and bruising around the umbilicus or in the groin and scrotum.  Ice packs and reclining will help.  Swelling and bruising can take several days to resolve.  6. It is common to experience some constipation if taking pain medication after surgery.  Increasing fluid intake and taking a stool softener (such as Colace) will usually help or prevent this problem from occurring.  A mild laxative (Milk of Magnesia or Miralax) should be taken according to package directions if there are no bowel movements after 48 hours. 7. Unless discharge instructions indicate otherwise, you may remove your bandages 24-48 hours after surgery, and you may shower at that time.  You may have steri-strips (small skin tapes) in place directly over the incision.  These strips should be left on the skin for 7-10 days.  If your surgeon used skin glue on the incision, you  may shower in 24 hours.  The glue will flake off over the next 2-3 weeks.  Any sutures or staples will be removed at the office during your follow-up visit. 8. ACTIVITIES:  You may resume regular (light) daily activities beginning the next day-such as daily self-care, walking, climbing stairs-gradually increasing activities as tolerated.  You may have sexual intercourse when it is comfortable.  Refrain from any heavy lifting or straining until approved by your doctor. a. You may drive when you are no longer taking prescription pain medication, you can comfortably wear a seatbelt, and you can safely maneuver your car and apply brakes. b. RETURN TO WORK:  ___Desk work in 5-7 days._______________________________________________________ 9. You should see your doctor in the office for a follow-up appointment approximately 2-3 weeks after your surgery.  Make sure that you call for this appointment within a day or two after you arrive home to insure a convenient appointment time. 10. OTHER INSTRUCTIONS:  __________________________________________________________________________________________________________________________________________________________________________________________  WHEN TO CALL YOUR DOCTOR: 1. Fever over 101.0 2. Inability to urinate 3. Nausea and/or vomiting 4. Extreme swelling or bruising 5. Continued bleeding from incision. 6. Increased pain, redness, or drainage from the incision  The clinic staff is available to answer your questions during regular business hours.  Please don't hesitate to call and ask to speak to one of the nurses for clinical concerns.  If you have a medical emergency, go to the nearest emergency room or call 911.  A surgeon from [facility] is always on call at the  hospital   [address], [address], [place], [place]  [address] ?  [address] [address], [place], [place]   [address] [phone] ? [phone] ? FAX [phone] Web site:  [email or web address]

## 2012-02-15 NOTE — Progress Notes (Signed)
Patient ID: Henry Kennedy, male   DOB: Dec 26, 1955, 57 y.o.   MRN: [id number]  Chief Complaint  Patient presents with  . Pre-op Exam    eval RIH    HPI Henry Kennedy is a 56 y.o. male.   HPI  He is referred by Dr. Vernie Ammons for further evaluation and treatment of a recurrent right inguinal hernia. He had a laparoscopic repair of a right inguinal hernia in [place] many years ago. He had an open left inguinal hernia repair with mesh here in [facility] by Dr. Maryagnes Amos.  About 2 or 3 months ago, he noticed a right inguinal bulge. He has been seeing Dr. 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 Dr. Vernie Ammons and noted a hernia and referred him over here.  He does self intermittent bladder catheterizations. No constipation or chronic coughing.  Past Medical History  Diagnosis Date  . PONV (postoperative nausea and vomiting)   . Cancer     skin  Basal  and squamous    Past Surgical History  Procedure Date  . Inguinal hernia repair 2003    Right  . Inguinal hernia repair 1993    left  . Tonsillectomy   . Skin cancer excision 2012    Nose  . Hernia repair 1993 2005  . Neck surgery 2013    Family History  Problem Relation Age of Onset  . ALS Father   . Cancer Maternal Grandfather     prostate    Social History History  Substance Use Topics  . Smoking status: Former Smoker -- 6 years  . Smokeless tobacco: Former Neurosurgeon    Quit date: 02/15/1983  . Alcohol Use: 1.2 oz/week    2 Glasses of wine per week    No Known Allergies  No current outpatient prescriptions on file.    Review of Systems Review of Systems  Constitutional: Negative.   Respiratory: Negative.   Cardiovascular: Negative.   Gastrointestinal: Negative.   Genitourinary: Positive for difficulty urinating.  Hematological: Negative.     Blood pressure 134/86, pulse 63, temperature 98 F (36.7 C), temperature source Temporal, height 5\' 10"  (1.778 m), weight 180 lb 3.2 oz  (81.738 kg), SpO2 99.00%.  Physical Exam Physical Exam  Constitutional: He appears well-developed and well-nourished. No distress.  Cardiovascular: Normal rate and regular rhythm.   Pulmonary/Chest: Effort normal and breath sounds normal.  Abdominal: Soft. He exhibits no mass. There is no tenderness.       Small supraumbilical scar.  Genitourinary:       Reducible right inguinal bulge. Right testicle is slightly enlarged. Small left inguinal scar with no bulging.  Musculoskeletal: Normal range of motion. He exhibits no edema.  Neurological: He is alert.  Skin: Skin is warm and dry.  Psychiatric: He has a normal mood and affect. His behavior is normal.    Data Reviewed  Dr. Margrett Rud note.  Assessment    Recurrent right inguinal hernia on the laparoscopic repair inside. He has also had a urodynamics done.    Plan    Open repair of recurrent right inguinal hernia with mesh and On-Q pump placement. If Dr. Vernie Ammons needs to do a procedure as well, we may be able to combine the two.  I have explained the procedure, risks, and aftercare of inguinal hernia repair.  Risks include but are not limited to bleeding, infection, wound problems, anesthesia, recurrence, bladder or intestine injury, urinary retention, testicular dysfunction, chronic pain, mesh problems.  He seems to  understand and agrees to proceed.       Takiyah Bohnsack J 02/15/2012, 9:58 AM

## 2012-03-16 ENCOUNTER — Encounter (INDEPENDENT_AMBULATORY_CARE_PROVIDER_SITE_OTHER): Payer: Self-pay

## 2012-03-18 DIAGNOSIS — K4091 Unilateral inguinal hernia, without obstruction or gangrene, recurrent: Secondary | ICD-10-CM

## 2012-03-21 ENCOUNTER — Telehealth (INDEPENDENT_AMBULATORY_CARE_PROVIDER_SITE_OTHER): Payer: Self-pay

## 2012-03-21 DIAGNOSIS — G8918 Other acute postprocedural pain: Secondary | ICD-10-CM

## 2012-03-21 MED ORDER — HYDROCODONE-ACETAMINOPHEN 5-325 MG PO TABS: 1.0000 | ORAL_TABLET | Freq: Four times a day (QID) | ORAL | Status: DC | PRN

## 2012-03-21 NOTE — Telephone Encounter (Signed)
Called pt and told him I will call in protocol hydrocodone and he was fine that. I also told him to take miralax or MOM if he does not have a BM in next day. Patient understood.

## 2012-03-21 NOTE — Telephone Encounter (Signed)
Message copied by Brennan Bailey on Mon Mar 21, 2012  5:00 PM ------      Message from: Isaias Sakai K      Created: Mon Mar 21, 2012  8:36 AM      Regarding: Dr Abbey Chatters      Contact: [phone]       Needs rx refill for 5 mil Oxycodene. Would like a call back so wife can come by and pick up. Had sx on Fri

## 2012-04-13 ENCOUNTER — Encounter (INDEPENDENT_AMBULATORY_CARE_PROVIDER_SITE_OTHER): Payer: Self-pay | Admitting: General Surgery

## 2012-04-13 ENCOUNTER — Ambulatory Visit (INDEPENDENT_AMBULATORY_CARE_PROVIDER_SITE_OTHER): Payer: 59 | Admitting: General Surgery

## 2012-04-13 VITALS — BP 140/76 | HR 60 | Temp 97.7°F | Resp 16 | Ht 70.0 in | Wt 175.8 lb

## 2012-04-13 DIAGNOSIS — Z9889 Other specified postprocedural states: Secondary | ICD-10-CM

## 2012-04-13 NOTE — Progress Notes (Signed)
He presents for postop followup after open right inguinal hernia repair with mesh.  Post op pain is improving.  No difficulty voiding or having BMs.  Swelling is decreasing.  P.E.  GU:  Right groin incision clean/dry/intact, swelling is minimal, repair is solid.  Assessment:  Doing well post hernia repair.  Plan:  Continue light activities for 6 weeks postop then slowly start to resume normal activities. Return prn.

## 2012-04-13 NOTE — Patient Instructions (Signed)
Resume normal activities as tolerated as we discussed 6 weeks after surgery date.

## 2012-07-22 IMAGING — CR DG CERVICAL SPINE 1V
1 series · 1 of 1 positions shown · non-contrast
Comparison: Intraoperative radiograph dated 04/15/2011.

CLINICAL DATA: Neck pain..

DG CERVICAL SPINE - 1 VIEW

[w c-spine lat]
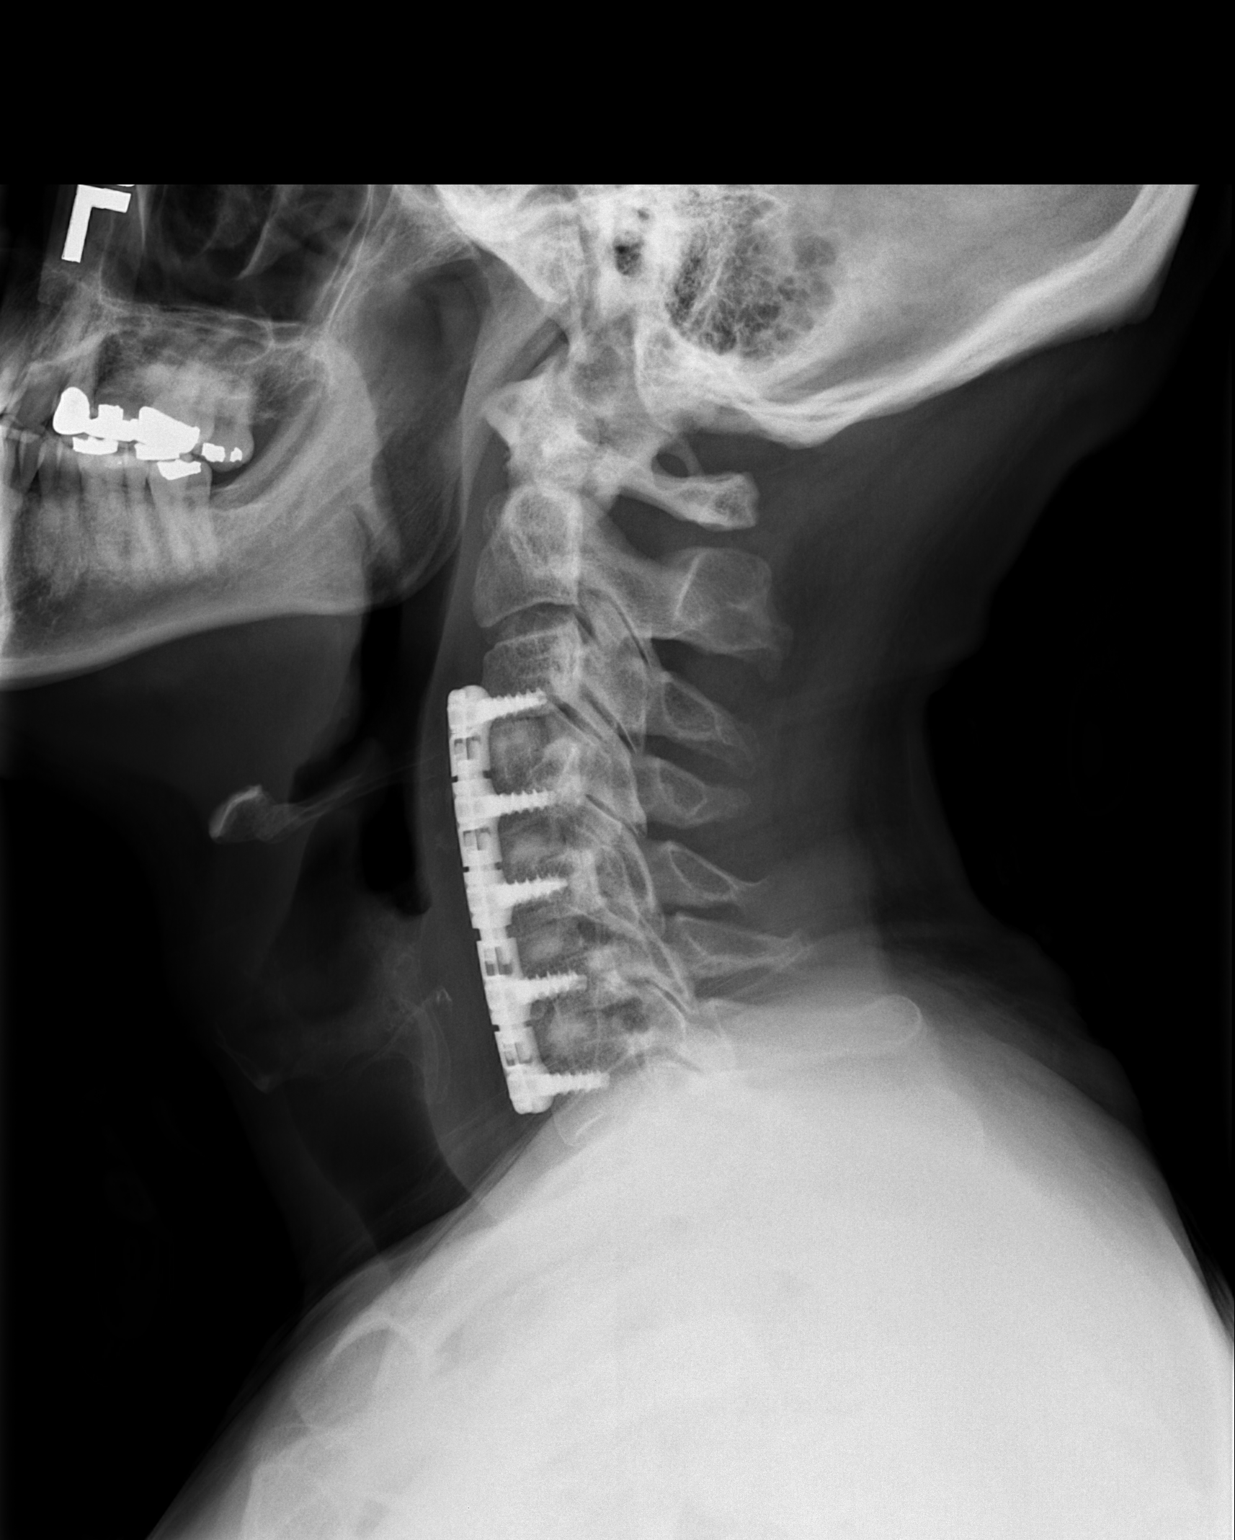

[1 of 1 positions shown; findings below may reference images not displayed]

FINDINGS: Postoperative changes of ACDF four noted from C3-C7.  No
evidence of hardware loosening or other hardware related
complications.  Prevertebral soft tissues are within normal limits.
Alignment appears anatomic.
IMPRESSION: 1.  Postoperative changes of ACDF from C3-C7, as above, without
acute abnormality on today's examination.

## 2012-08-31 IMAGING — CR DG CERVICAL SPINE 1V
1 series · 1 of 1 positions shown · non-contrast
Comparison: 05/14/2011

CLINICAL DATA: Cevicalgia, surgery [DATE]

DG CERVICAL SPINE - 1 VIEW

[view not recorded]
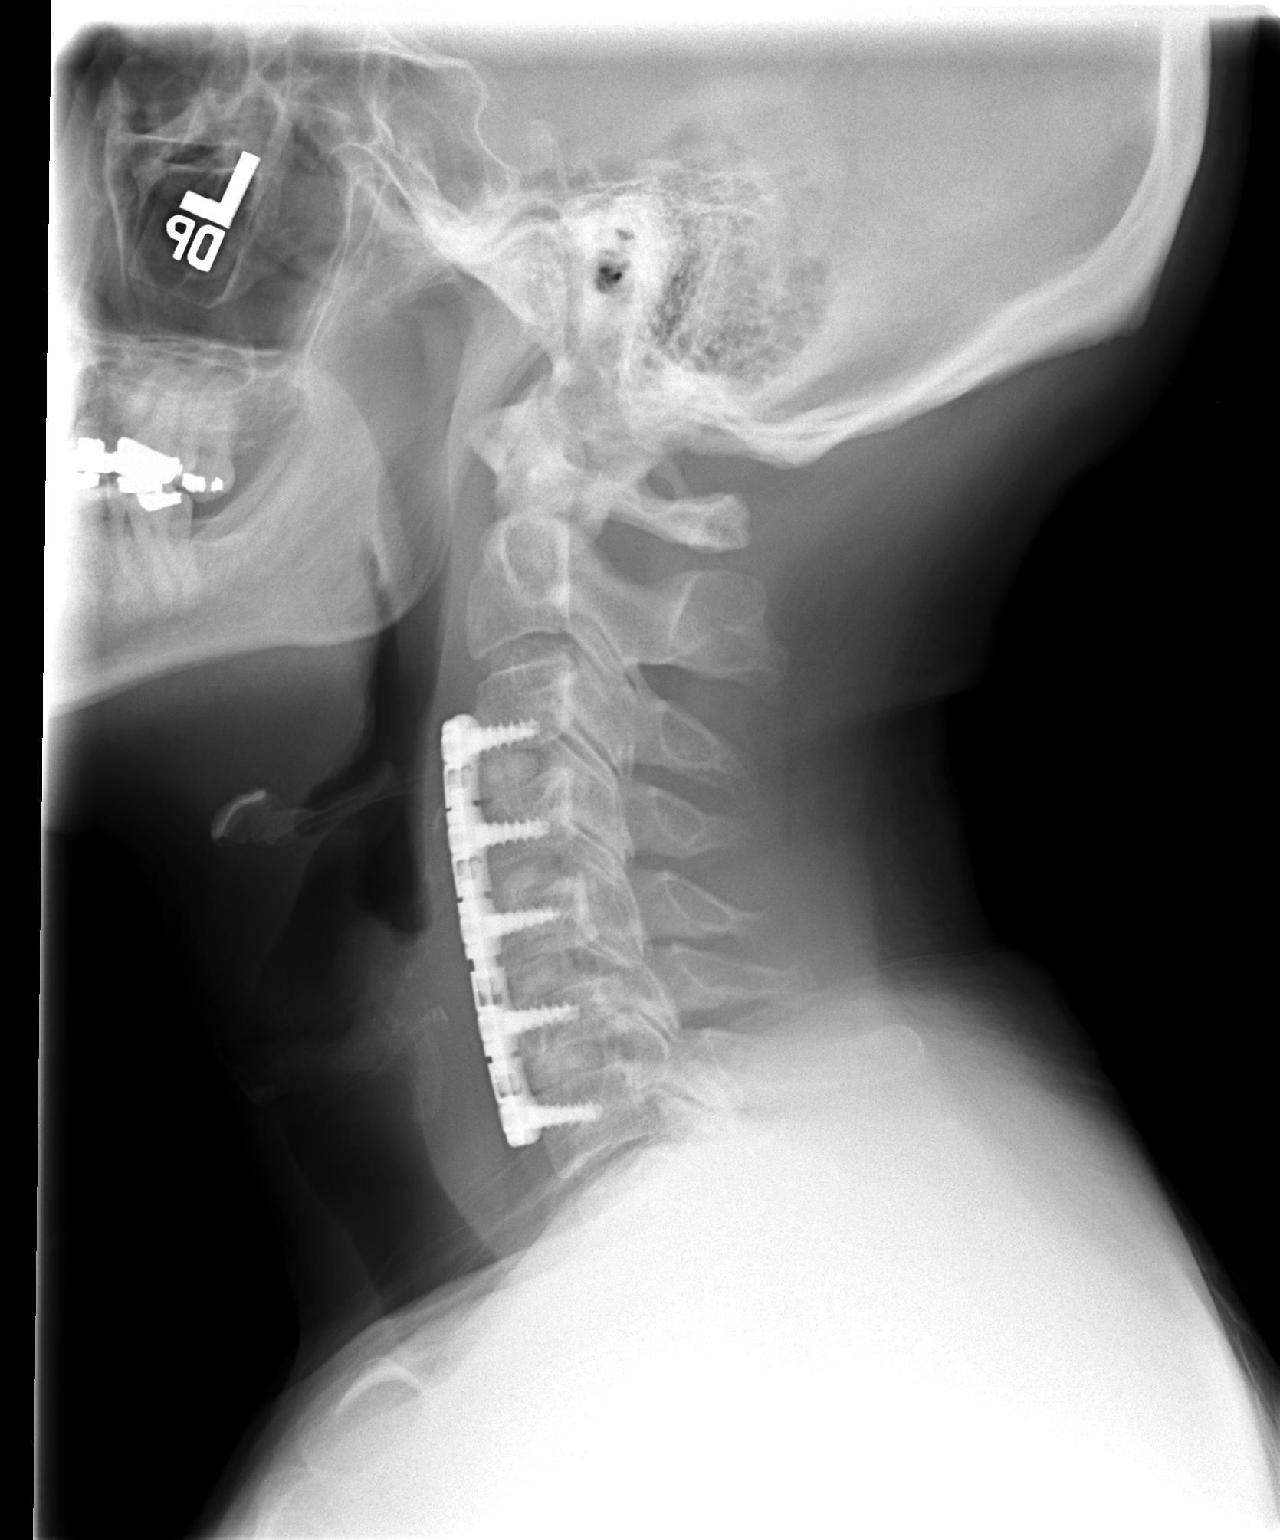

[1 of 1 positions shown; findings below may reference images not displayed]

FINDINGS: Stable alignment status post C3-7 ACDF.

No evidence of hardware complication.
IMPRESSION: Stable alignment status post C3-7 ACDF.

## 2017-07-14 ENCOUNTER — Other Ambulatory Visit: Payer: Self-pay | Admitting: Urology

## 2017-08-02 ENCOUNTER — Encounter (HOSPITAL_BASED_OUTPATIENT_CLINIC_OR_DEPARTMENT_OTHER): Payer: Self-pay

## 2017-08-04 ENCOUNTER — Other Ambulatory Visit: Payer: Self-pay

## 2017-08-04 ENCOUNTER — Encounter (HOSPITAL_BASED_OUTPATIENT_CLINIC_OR_DEPARTMENT_OTHER): Payer: Self-pay

## 2017-08-04 NOTE — Progress Notes (Signed)
Spoke with: "Terrial Rhodes NPO:  After Midnight, no gum, candy, or mints   Arrival time:  [id number] Labs:  N/A AM medications:  None Pre op orders: Yes Ride home: Anderson Malta (wife) [phone]

## 2017-08-09 ENCOUNTER — Ambulatory Visit (HOSPITAL_BASED_OUTPATIENT_CLINIC_OR_DEPARTMENT_OTHER)
Admission: RE | Admit: 2017-08-09 | Discharge: 2017-08-09 | Disposition: A | Discharge status: Home / Self Care | Payer: Managed Care, Other (non HMO) | Source: Ambulatory Visit | Attending: Urology | Admitting: Urology

## 2017-08-09 ENCOUNTER — Encounter (HOSPITAL_BASED_OUTPATIENT_CLINIC_OR_DEPARTMENT_OTHER): Payer: Self-pay

## 2017-08-09 ENCOUNTER — Encounter (HOSPITAL_BASED_OUTPATIENT_CLINIC_OR_DEPARTMENT_OTHER)
Admission: RE | Disposition: A | Discharge status: Home / Self Care | Payer: Self-pay | Source: Ambulatory Visit | Attending: Urology

## 2017-08-09 ENCOUNTER — Ambulatory Visit (HOSPITAL_BASED_OUTPATIENT_CLINIC_OR_DEPARTMENT_OTHER): Payer: Managed Care, Other (non HMO) | Admitting: Anesthesiology

## 2017-08-09 DIAGNOSIS — Z87891 Personal history of nicotine dependence: Secondary | ICD-10-CM | POA: Diagnosis not present

## 2017-08-09 DIAGNOSIS — N433 Hydrocele, unspecified: Secondary | ICD-10-CM | POA: Diagnosis present

## 2017-08-09 DIAGNOSIS — N430 Encysted hydrocele: Secondary | ICD-10-CM

## 2017-08-09 HISTORY — PX: HYDROCELE EXCISION: SHX482

## 2017-08-09 HISTORY — DX: Hydrocele, unspecified: N43.3

## 2017-08-09 HISTORY — DX: Personal history of other diseases of the digestive system: Z87.19

## 2017-08-09 SURGERY — HYDROCELECTOMY
Anesthesia: General | Site: Scrotum | Laterality: Right

## 2017-08-09 MED ORDER — PROPOFOL 10 MG/ML IV BOLUS
INTRAVENOUS | Status: DC | PRN
  Administered 2017-08-09: 200 mg via INTRAVENOUS

## 2017-08-09 MED ORDER — CEFAZOLIN SODIUM-DEXTROSE 2-4 GM/100ML-% IV SOLN
INTRAVENOUS | Status: AC
  Filled 2017-08-09: qty 100

## 2017-08-09 MED ORDER — DEXAMETHASONE SODIUM PHOSPHATE 10 MG/ML IJ SOLN
INTRAMUSCULAR | Status: AC
  Filled 2017-08-09: qty 1

## 2017-08-09 MED ORDER — MIDAZOLAM HCL 2 MG/2ML IJ SOLN
INTRAMUSCULAR | Status: AC
  Filled 2017-08-09: qty 2

## 2017-08-09 MED ORDER — FENTANYL CITRATE (PF) 100 MCG/2ML IJ SOLN
INTRAMUSCULAR | Status: AC
  Filled 2017-08-09: qty 2

## 2017-08-09 MED ORDER — FENTANYL CITRATE (PF) 100 MCG/2ML IJ SOLN
INTRAMUSCULAR | Status: DC | PRN
  Administered 2017-08-09: 50 ug via INTRAVENOUS
  Administered 2017-08-09: 25 ug via INTRAVENOUS

## 2017-08-09 MED ORDER — GLYCOPYRROLATE 0.2 MG/ML IV SOSY
PREFILLED_SYRINGE | INTRAVENOUS | Status: DC | PRN
  Administered 2017-08-09: .2 mg via INTRAVENOUS

## 2017-08-09 MED ORDER — HYDROMORPHONE HCL 1 MG/ML IJ SOLN
0.2500 mg | INTRAMUSCULAR | Status: DC | PRN
  Filled 2017-08-09: qty 0.5

## 2017-08-09 MED ORDER — LIDOCAINE 2% (20 MG/ML) 5 ML SYRINGE
INTRAMUSCULAR | Status: AC
  Filled 2017-08-09: qty 5

## 2017-08-09 MED ORDER — PROMETHAZINE HCL 25 MG/ML IJ SOLN
6.2500 mg | INTRAMUSCULAR | Status: DC | PRN
  Filled 2017-08-09: qty 1

## 2017-08-09 MED ORDER — OXYCODONE HCL 5 MG/5ML PO SOLN
5.0000 mg | Freq: Once | ORAL | Status: DC | PRN
  Filled 2017-08-09: qty 5

## 2017-08-09 MED ORDER — CEFAZOLIN SODIUM-DEXTROSE 2-4 GM/100ML-% IV SOLN
2.0000 g | Freq: Once | INTRAVENOUS | Status: AC
  Administered 2017-08-09: 2 g via INTRAVENOUS
  Filled 2017-08-09: qty 100

## 2017-08-09 MED ORDER — LIDOCAINE 2% (20 MG/ML) 5 ML SYRINGE
INTRAMUSCULAR | Status: DC | PRN
  Administered 2017-08-09: 60 mg via INTRAVENOUS

## 2017-08-09 MED ORDER — ONDANSETRON HCL 4 MG/2ML IJ SOLN
INTRAMUSCULAR | Status: DC | PRN
  Administered 2017-08-09: 4 mg via INTRAVENOUS

## 2017-08-09 MED ORDER — GLYCOPYRROLATE 0.2 MG/ML IV SOSY
PREFILLED_SYRINGE | INTRAVENOUS | Status: AC
  Filled 2017-08-09: qty 5

## 2017-08-09 MED ORDER — BUPIVACAINE-EPINEPHRINE 0.5% -1:200000 IJ SOLN
INTRAMUSCULAR | Status: DC | PRN
  Administered 2017-08-09: 11 mL

## 2017-08-09 MED ORDER — EPHEDRINE SULFATE-NACL 50-0.9 MG/10ML-% IV SOSY
PREFILLED_SYRINGE | INTRAVENOUS | Status: DC | PRN
  Administered 2017-08-09 (×2): 5 mg via INTRAVENOUS

## 2017-08-09 MED ORDER — MEPERIDINE HCL 25 MG/ML IJ SOLN
6.2500 mg | INTRAMUSCULAR | Status: DC | PRN
  Filled 2017-08-09: qty 1

## 2017-08-09 MED ORDER — OXYCODONE HCL 5 MG PO TABS
5.0000 mg | ORAL_TABLET | Freq: Once | ORAL | Status: DC | PRN
  Filled 2017-08-09: qty 1

## 2017-08-09 MED ORDER — ONDANSETRON HCL 4 MG/2ML IJ SOLN
INTRAMUSCULAR | Status: AC
  Filled 2017-08-09: qty 2

## 2017-08-09 MED ORDER — EPHEDRINE 5 MG/ML INJ
INTRAVENOUS | Status: AC
  Filled 2017-08-09: qty 10

## 2017-08-09 MED ORDER — DEXAMETHASONE SODIUM PHOSPHATE 10 MG/ML IJ SOLN
INTRAMUSCULAR | Status: DC | PRN
  Administered 2017-08-09: 10 mg via INTRAVENOUS

## 2017-08-09 MED ORDER — HYDROCODONE-ACETAMINOPHEN 10-325 MG PO TABS: 1.0000 | ORAL_TABLET | ORAL | 0 refills | Status: DC | PRN

## 2017-08-09 MED ORDER — MIDAZOLAM HCL 2 MG/2ML IJ SOLN
INTRAMUSCULAR | Status: DC | PRN
  Administered 2017-08-09: 2 mg via INTRAVENOUS

## 2017-08-09 MED ORDER — TRIPLE ANTIBIOTIC 3.5-400-5000 EX OINT
TOPICAL_OINTMENT | CUTANEOUS | Status: DC | PRN
  Administered 2017-08-09: 1

## 2017-08-09 MED ORDER — WHITE PETROLATUM EX OINT
TOPICAL_OINTMENT | CUTANEOUS | Status: AC
  Filled 2017-08-09: qty 5

## 2017-08-09 MED ORDER — PROPOFOL 10 MG/ML IV BOLUS
INTRAVENOUS | Status: AC
  Filled 2017-08-09: qty 20

## 2017-08-09 MED ORDER — LACTATED RINGERS IV SOLN
INTRAVENOUS | Status: DC
  Administered 2017-08-09: 08:00:00 via INTRAVENOUS
  Filled 2017-08-09: qty 1000

## 2017-08-09 SURGICAL SUPPLY — 29 items
BLADE CLIPPER SURG (BLADE) ×2 IMPLANT
BLADE SURG 15 STRL LF DISP TIS (BLADE) ×1 IMPLANT
BLADE SURG 15 STRL SS (BLADE) ×1
BNDG GAUZE ELAST 4 BULKY (GAUZE/BANDAGES/DRESSINGS) ×2 IMPLANT
CANISTER SUCTION 1200CC (MISCELLANEOUS) ×2 IMPLANT
COVER BACK TABLE 60X90IN (DRAPES) ×2 IMPLANT
COVER MAYO STAND STRL (DRAPES) ×2 IMPLANT
DRAIN PENROSE 18X1/4 LTX STRL (WOUND CARE) ×2 IMPLANT
DRAPE LAPAROTOMY 100X72 PEDS (DRAPES) ×2 IMPLANT
ELECT REM PT RETURN 9FT ADLT (ELECTROSURGICAL) ×2
ELECTRODE REM PT RTRN 9FT ADLT (ELECTROSURGICAL) ×1 IMPLANT
GLOVE BIO SURGEON STRL SZ8 (GLOVE) ×2 IMPLANT
GOWN STRL REUS W/ TWL LRG LVL3 (GOWN DISPOSABLE) ×1 IMPLANT
GOWN STRL REUS W/ TWL XL LVL3 (GOWN DISPOSABLE) ×1 IMPLANT
GOWN STRL REUS W/TWL LRG LVL3 (GOWN DISPOSABLE) ×1
GOWN STRL REUS W/TWL XL LVL3 (GOWN DISPOSABLE) ×1
KIT TURNOVER CYSTO (KITS) ×2 IMPLANT
NEEDLE HYPO 25X1 1.5 SAFETY (NEEDLE) ×2 IMPLANT
NS IRRIG 500ML POUR BTL (IV SOLUTION) ×2 IMPLANT
PACK BASIN DAY SURGERY FS (CUSTOM PROCEDURE TRAY) ×2 IMPLANT
PENCIL BUTTON HOLSTER BLD 10FT (ELECTRODE) ×2 IMPLANT
SUPPORT SCROTAL LG STRP (MISCELLANEOUS) ×2 IMPLANT
SUT CHROMIC 3 0 SH 27 (SUTURE) ×4 IMPLANT
SUT ETHILON 3 0 PS 1 (SUTURE) ×2 IMPLANT
SYR CONTROL 10ML LL (SYRINGE) ×2 IMPLANT
TOWEL OR 17X24 6PK STRL BLUE (TOWEL DISPOSABLE) ×4 IMPLANT
TRAY DSU PREP LF (CUSTOM PROCEDURE TRAY) ×2 IMPLANT
TUBE CONNECTING 12X1/4 (SUCTIONS) ×2 IMPLANT
YANKAUER SUCT BULB TIP NO VENT (SUCTIONS) ×2 IMPLANT

## 2017-08-09 NOTE — Anesthesia Preprocedure Evaluation (Signed)
Anesthesia Evaluation  Patient identified by MRN, date of birth, ID band Patient awake    Reviewed: Allergy & Precautions, H&P , NPO status , Patient's Chart, lab work & pertinent test results, reviewed documented beta blocker date and time   History of Anesthesia Complications (+) PONV and history of anesthetic complications  Airway Mallampati: I  TM Distance: >3 FB Neck ROM: Full    Dental  (+) Teeth Intact   Pulmonary neg pulmonary ROS, former smoker,    Pulmonary exam normal breath sounds clear to auscultation       Cardiovascular negative cardio ROS Normal cardiovascular exam Rhythm:Regular Rate:Normal - Systolic murmurs    Neuro/Psych    GI/Hepatic negative GI ROS, Neg liver ROS,   Endo/Other  negative endocrine ROS  Renal/GU negative Renal ROS     Musculoskeletal   Abdominal   Peds  Hematology   Anesthesia Other Findings   Reproductive/Obstetrics                             Anesthesia Physical  Anesthesia Plan  ASA: II  Anesthesia Plan: General   Post-op Pain Management:    Induction: Intravenous  PONV Risk Score and Plan: 4 or greater and Ondansetron, Treatment may vary due to age or medical condition, Dexamethasone and Midazolam  Airway Management Planned: LMA  Additional Equipment:   Intra-op Plan:   Post-operative Plan: Extubation in OR  Informed Consent: I have reviewed the patients History and Physical, chart, labs and discussed the procedure including the risks, benefits and alternatives for the proposed anesthesia with the patient or authorized representative who has indicated his/her understanding and acceptance.   Dental advisory given  Plan Discussed with: CRNA  Anesthesia Plan Comments:         Anesthesia Quick Evaluation

## 2017-08-09 NOTE — Anesthesia Procedure Notes (Signed)
Procedure Name: LMA Insertion Date/Time: 08/09/2017 9:41 AM Performed by: Suan Halter, CRNA Pre-anesthesia Checklist: Patient identified, Emergency Drugs available, Suction available and Patient being monitored Patient Re-evaluated:Patient Re-evaluated prior to induction Oxygen Delivery Method: Circle system utilized Preoxygenation: Pre-oxygenation with 100% oxygen Induction Type: IV induction Ventilation: Mask ventilation without difficulty LMA: LMA inserted LMA Size: 4.0 Number of attempts: 1 Airway Equipment and Method: Bite block Placement Confirmation: positive ETCO2 Tube secured with: Tape Dental Injury: Teeth and Oropharynx as per pre-operative assessment

## 2017-08-09 NOTE — Transfer of Care (Signed)
Immediate Anesthesia Transfer of Care Note  Patient: Henry Kennedy  Procedure(s) Performed: Procedure(s) (LRB): HYDROCELECTOMY ADULT (Right)  Patient Location: PACU  Anesthesia Type: General  Level of Consciousness: awake, oriented, sedated and patient cooperative  Airway & Oxygen Therapy: Patient Spontanous Breathing and Patient connected to face mask oxygen  Post-op Assessment: Report given to PACU RN and Post -op Vital signs reviewed and stable  Post vital signs: Reviewed and stable  Complications: No apparent anesthesia complications  Last Vitals:  Vitals Value Taken Time  BP 138/81 08/09/2017 10:37 AM  Temp 36.4 C 08/09/2017 10:37 AM  Pulse 68 08/09/2017 10:40 AM  Resp 19 08/09/2017 10:40 AM  SpO2 100 % 08/09/2017 10:40 AM  Vitals shown include unvalidated device data.  Last Pain:  Vitals:   08/09/17 1037  TempSrc:   PainSc: 0-No pain      Patients Stated Pain Goal: 5 (08/09/17 0745)

## 2017-08-09 NOTE — Op Note (Signed)
PATIENT:  Henry Kennedy  PRE-OPERATIVE DIAGNOSIS: Right hydrocele  POST-OPERATIVE DIAGNOSIS:  Same  PROCEDURE:  Procedure(s): Rightt hydrocelectomy  SURGEON:  Claybon Jabs  INDICATION: Mr. Say is a 62 year old male with a large, symptomatic right hydrocele.  It has increased in size over time.  We have therefore discussed the options for management and he has elected to proceed with surgical correction.  ANESTHESIA:  General  EBL:  Minimal  DRAINS: 1/4 inch Penrose drain  LOCAL MEDICATIONS USED: 1/2% Marcaine with epinephrine  SPECIMEN:  None  DISPOSITION OF SPECIMEN:  N/A  Description of procedure: After informed consent the patient was brought to the major OR, placed on the table and administered general anesthesia. His genitalia was then sterilely prepped and draped. An official timeout was then performed.  A midline median raphae scrotal incision was then made and carried down over the right hydrocele. The tissue over the hydrocele was cleared using a combination of sharp and blunt technique. The hydrocele was then opened, drained of clear amber fluid and delivered through the incision. The excess hydrocele sac tissue was excised with the Bovie electrocautery and then I cauterized the edges. I then reapproximated the edges posteriorly behind the epididymis with a running, locking 3-0 chromic suture. The appendix testis was removed with the Bovie.   His testicle was then replaced in the normal anatomic position and his right hemiscrotum. I then closed the deep scrotal tissue with running 3-0 chromic suture in a locking fashion. I injected half percent Marcaine in the subcutaneous tissue and closed the skin with running 3-0 chromic. Neosporin, a sterile gauze dressing, fluff Kerlix and a scrotal support were applied. The patient tolerated the procedure well no intraoperative complications. Needle sponge and instrument counts were correct at the end of the operation.   PLAN OF  CARE: Discharge to home after PACU  PATIENT DISPOSITION:  PACU - hemodynamically stable.

## 2017-08-09 NOTE — Discharge Instructions (Signed)
Scrotal surgery postoperative instructions  Wound:  In most cases your incision will have absorbable sutures that will dissolve within the first 10-20 days. Some will fall out even earlier. Expect some redness as the sutures dissolved but this should occur only around the sutures. If there is generalized redness, especially with increasing pain or swelling, let us know. The scrotum will very likely get "black and blue" as the blood in the tissues spread. Sometimes the whole scrotum will turn colors. The black and blue is followed by a yellow and brown color. In time, all the discoloration will go away. In some cases some firm swelling in the area of the testicle may persist for up to 4-6 weeks after the surgery and is considered normal in most cases.  Diet:  You may return to your normal diet within 24 hours following your surgery. You may note some mild nausea and possibly vomiting the first 6-8 hours following surgery. This is usually due to the side effects of anesthesia, and will disappear quite soon. I would suggest clear liquids and a very light meal the first evening following your surgery.  Activity:  Your physical activity should be restricted the first 48 hours. During that time you should remain relatively inactive, moving about only when necessary. During the first 7-10 days following surgery he should avoid lifting any heavy objects (anything greater than 15 pounds), and avoid strenuous exercise. If you work, ask us specifically about your restrictions, both for work and home. We will write a note to your employer if needed.  You should plan to wear a tight pair of jockey shorts or an athletic supporter for the first 4-5 days, even to sleep. This will keep the scrotum immobilized to some degree and keep the swelling down.  Ice packs should be placed on and off over the scrotum for the first 48 hours. Frozen peas or corn in a ZipLock bag can be frozen, used and re-frozen. Fifteen minutes  on and 15 minutes off is a reasonable schedule. The ice is a good pain reliever and keeps the swelling down.  Hygiene:  You may shower 48 hours after your surgery. Tub bathing should be restricted until the seventh day.   Medication:  You will be sent home with some type of pain medication. In many cases you will be sent home with a narcotic pain pill (hydrococone or oxycodone). If the pain is not too bad, you may take either Tylenol (acetaminophen) or Advil (ibuprofen) which contain no narcotic agents, and might be tolerated a little better, with fewer side effects. If the pain medication you are sent home with does not control the pain, you will have to let us know. Some narcotic pain medications cannot be given or refilled by a phone call to a pharmacy.  Problems you should report to us:   Fever of 101.0 degrees Fahrenheit or greater.  Moderate or severe swelling under the skin incision or involving the scrotum.  Drug reaction such as hives, a rash, nausea or vomiting.    Post Anesthesia Home Care Instructions  Activity: Get plenty of rest for the remainder of the day. A responsible individual must stay with you for 24 hours following the procedure.  For the next 24 hours, DO NOT: -Drive a car -Operate machinery -Drink alcoholic beverages -Take any medication unless instructed by your physician -Make any legal decisions or sign important papers.  Meals: Start with liquid foods such as gelatin or soup. Progress to regular foods as   tolerated. Avoid greasy, spicy, heavy foods. If nausea and/or vomiting occur, drink only clear liquids until the nausea and/or vomiting subsides. Call your physician if vomiting continues.  Special Instructions/Symptoms: Your throat may feel dry or sore from the anesthesia or the breathing tube placed in your throat during surgery. If this causes discomfort, gargle with warm salt water. The discomfort should disappear within 24 hours.  

## 2017-08-09 NOTE — H&P (Signed)
HPI: Henry Kennedy is a 62 year-old male with a large right hydrocele.  He first noticed his hydrocele 9 years ago. His hydrocele is on the right side. The patient has undergone a scrotal ultrasound. He does have pain on the side of his hydrocele. His hydrocele does cause restriction of normal activities.   He has not had injuries to the testicles or scrotum. He has not had a testicular infection. His hydrocele does bother him enough to consider surgical repair.   07/07/17: He had a right epididymal cyst/spermatocele diagnosed in 2/10 by ultrasound. Over the years he has had swelling of his right hemiscrotum that now is causing some slight aching in the right groin region and is getting in the way. He is symptomatic and would like to consider surgical repair.     CC: I have bladder atony.  HPI: He does catheterize intermittently. He does not have a permanent indwelling Foley catheter.   He does not have pain with urination. He is having problems getting his urine stream started. He does have to strain or bear down to start his urinary stream. He does not have a good size and strength to his urinary stream.   He is having problems emptying his bladder.   07/07/17: He continues to require intermittent self catheterization. He said over time he feels that not only has he not improved but he says it may have become slightly worse. He is not able to urinate at all. He was evaluated with urodynamics and we discussed re-evaluation as an option today. He has had 1 infection that has been documented.     ALLERGIES: No Allergies    MEDICATIONS: Self-Cath Straight Tip Miscellaneous 0 Does Not Apply     GU PSH: None     PSH Notes: Inguinal Hernia Repair, Inguinal Hernia Repair, Neck Surgery   NON-GU PSH: Hernia Repair Neck Surgery (Unspecified)    GU PMH: Areflexic bladder, Hypotonic bladder - 2013-06-26 Urinary Retention, Unspec, Incomplete bladder emptying - 06/26/2013, Urinary retention, - June 26, 2013, Acute  Urinary Retention, - 2012/06/26 BPH w/LUTS, Benign prostatic hyperplasia with urinary obstruction - 06-26-2012 Spermatocele of epididymis, Unspec, Spermatocele - Jun 26, 2012 Unil Inguinal Hernia W/O obst or gang,non-recurrent, Direct Inguinal Hernia On The Right Side - 06-26-2012      PMH Notes:  2011-04-23 13:40:33 - Note: Herniated Cervical Disc   NON-GU PMH: Encounter for general adult medical examination without abnormal findings, Encounter for preventive health examination - June 27, 2011    FAMILY HISTORY: Death In The Family Father - Runs In Family Family Health Status Number - Runs In Family Prostate Cancer - Grandfather, Grandfather, Father   SOCIAL HISTORY: Marital Status: Married Preferred Language: English; Ethnicity: Not Hispanic Or Latino; Race: White Current Smoking Status: Patient does not smoke anymore.   Tobacco Use Assessment Completed: Used Tobacco in last 30 days? Types of alcohol consumed: Beer. Social Drinker.  Drinks 2 caffeinated drinks per day.     Notes: Caffeine Use, Alcohol Use, Marital History - Currently Married, Smoking Cigarettes For ____ Pack-years, Occupation:   REVIEW OF SYSTEMS:    GU Review Male:   Patient denies frequent urination, hard to postpone urination, burning/ pain with urination, get up at night to urinate, leakage of urine, stream starts and stops, trouble starting your stream, have to strain to urinate , erection problems, and penile pain.  Gastrointestinal (Upper):   Patient denies indigestion/ heartburn, vomiting, and nausea.  Gastrointestinal (Lower):   Patient denies diarrhea and constipation.  Constitutional:   Patient  denies fever, night sweats, weight loss, and fatigue.  Skin:   Patient denies skin rash/ lesion and itching.  Eyes:   Patient denies blurred vision and double vision.  Ears/ Nose/ Throat:   Patient denies sore throat and sinus problems.  Hematologic/Lymphatic:   Patient denies swollen glands and easy bruising.  Cardiovascular:   Patient denies  leg swelling and chest pains.  Respiratory:   Patient denies cough and shortness of breath.  Endocrine:   Patient denies excessive thirst.  Musculoskeletal:   Patient denies back pain and joint pain.  Neurological:   Patient denies headaches and dizziness.  Psychologic:   Patient denies depression and anxiety.   VITAL SIGNS:    Weight 198 lb / 89.81 kg  Height 70 in / 177.8 cm  BP 154/82 mmHg  Pulse 66 /min  BMI 28.4 kg/m   GU PHYSICAL EXAMINATION:    Anus and Perineum: No hemorrhoids. No anal stenosis. No rectal fissure, no anal fissure. No edema, no dimple, no perineal tenderness, no anal tenderness.  Scrotum: No lesions. No edema. No cysts. No warts.  Epididymides: Right: no spermatocele, no masses, no cysts, no tenderness, no induration, no enlargement. Left: no spermatocele, no masses, no cysts, no tenderness, no induration, no enlargement.  Testes: 3-5 cm hydrocele right testis. No tenderness, no swelling, no enlargement left testis. No tenderness, no swelling, no enlargement right testis. Normal location left testis. Normal location right testis. No mass, no cyst, no varicocele, no hydrocele left testis. No mass, no cyst, no varicocele right testis.   Urethral Meatus: Normal size. No lesion, no wart, no discharge, no polyp. Normal location.  Penis: Circumcised, no warts, no cracks. No dorsal Peyronie's plaques, no left corporal Peyronie's plaques, no right corporal Peyronie's plaques, no scarring, no warts. No balanitis, no meatal stenosis.  Prostate: Prostate 1 1/2+ size. Left lobe normal consistency, right lobe normal consistency. Symmetrical lobes. No prostate nodule. Left lobe no tenderness, right lobe no tenderness.   Seminal Vesicles: Nonpalpable.  Sphincter Tone: Normal sphincter. No rectal tenderness. No rectal mass.    MULTI-SYSTEM PHYSICAL EXAMINATION:    Constitutional: Well-nourished. No physical deformities. Normally developed. Good grooming.  Neck: Neck symmetrical,  not swollen. Normal tracheal position.  Respiratory: No labored breathing, no use of accessory muscles. Normal breath sounds.  Cardiovascular: Normal temperature, normal extremity pulses, no swelling, no varicosities.   Lymphatic: No enlargement of neck, axillae, groin.  Skin: No paleness, no jaundice, no cyanosis. No lesion, no ulcer, no rash.  Neurologic / Psychiatric: Oriented to time, oriented to place, oriented to person. No depression, no anxiety, no agitation.  Gastrointestinal: No mass, no tenderness, no rigidity, non obese abdomen.  Eyes: Normal conjunctivae. Normal eyelids.  Ears, Nose, Mouth, and Throat: Left ear no scars, no lesions, no masses. Right ear no scars, no lesions, no masses. Nose no scars, no lesions, no masses. Normal hearing. Normal lips.  Musculoskeletal: Normal gait and station of head and neck.     PAST DATA REVIEWED:  Source Of History:  Patient, Medical Record Request  Lab Test Review:   PSA, BUN/Creatinine  Records Review:   Previous Patient Records, POC Tool  Urine Test Review:   Urine Culture  Notes:                     A urine culture in 3/17 was found to be negative. In 9/18 a urine culture was done and was positive for Serratia.  His creatinine in 2/16 was  1.03. A PSA in 3/17 was 2.0.   PROCEDURES:           PVR Ultrasound - 29562  Scanned Volume: 11 cc  Notes: Using self catheterization he is able to completely empty his bladder.         Urinalysis Dipstick Dipstick Cont'd  Color: Yellow Bilirubin: Neg  Appearance: Clear Ketones: Neg  Specific Gravity: 1.025 Blood: Neg  pH: 6.0 Protein: Trace  Glucose: Neg Urobilinogen: 0.2    Nitrites: Neg    Leukocyte Esterase: Neg    ASSESSMENT/PLAN:  Hydrocele, Unspec - N43.3 Right, Worsening - He seems to have a very large right hydrocele that is symptomatic and we discussed the options for treatment including aspiration versus surgical. He is elected to proceed with hydrocelectomy after I have  discussed the procedure with him in detail including the incision used, the risks and complications, the probability of success, the outpatient nature of the procedure as well as the anticipated postoperative course.

## 2017-08-10 ENCOUNTER — Encounter (HOSPITAL_BASED_OUTPATIENT_CLINIC_OR_DEPARTMENT_OTHER): Payer: Self-pay | Admitting: Urology

## 2017-08-10 NOTE — Anesthesia Postprocedure Evaluation (Signed)
Anesthesia Post Note  Patient: Henry Kennedy  Procedure(s) Performed: HYDROCELECTOMY ADULT (Right Scrotum)     Patient location during evaluation: PACU Anesthesia Type: General Level of consciousness: sedated and patient cooperative Pain management: pain level controlled Vital Signs Assessment: post-procedure vital signs reviewed and stable Respiratory status: spontaneous breathing Cardiovascular status: stable Anesthetic complications: no    Last Vitals:  Vitals:   08/09/17 1100 08/09/17 1202  BP: 139/87 (!) 139/97  Pulse: 66 63  Resp: 20 20  Temp:  36.6 C  SpO2: 100% 100%    Last Pain:  Vitals:   08/09/17 1202  TempSrc:   PainSc: 0-No pain                 Nolon Nations

## 2018-09-08 ENCOUNTER — Telehealth: Payer: Self-pay | Admitting: *Deleted

## 2018-09-08 NOTE — Telephone Encounter (Signed)
REFERRAL SENT TO SCHEDULING AND NOTES ON FILE FROM Carolee Rota, FNP [facility] [phone].

## 2018-09-27 NOTE — Progress Notes (Signed)
Virtual Visit via Video Note   This visit type was conducted due to national recommendations for restrictions regarding the COVID-19 Pandemic (e.g. social distancing) in an effort to limit this patient's exposure and mitigate transmission in our community.  Due to his co-morbid illnesses, this patient is at least at moderate risk for complications without adequate follow up.  This format is felt to be most appropriate for this patient at this time.  All issues noted in this document were discussed and addressed.  A limited physical exam was performed with this format.  Please refer to the patient's chart for his consent to telehealth for [facility]   Date:  09/28/2018   ID:  Henry Kennedy, DOB [birth year], MRN [id number]  Patient Location:Home Provider Location: Home  PCP:  Orpah Melter, MD  Cardiologist:  Dr Stanford Breed  Evaluation Performed:  Consultation - Henry Kennedy was referred by Orpah Melter MD for the evaluation of chest pain.  Chief Complaint:  Chest pain  History of Present Illness:    63 year old male for evaluation of chest pain at request of Orpah Melter, MD. Patient has no prior cardiac history.  He does now describe occasional chest tightness that can last up to 1/2-day.  It is not exertional, pleuritic or positional.  He has had this intermittently for years by his report.  He has some dyspnea on exertion but no orthopnea, PND, pedal edema, palpitations or syncope.  Cardiology now asked to evaluate.  The patient does not have symptoms concerning for COVID-19 infection (fever, chills, cough, or new shortness of breath).    Past Medical History:  Diagnosis Date  . Cancer ([place])    skin  Basal  and squamous nose   . History of bilateral inguinal hernias   . Hyperlipidemia   . Hypertension   . PONV (postoperative nausea and vomiting)   . Right hydrocele    Past Surgical History:  Procedure Laterality Date  . ANTERIOR CERVICAL DECOMP/DISCECTOMY FUSION   04/15/2011  . COLONOSCOPY    . HERNIA REPAIR  1993 2005  . HYDROCELE EXCISION Right 08/09/2017   Procedure: HYDROCELECTOMY ADULT;  Surgeon: Kathie Rhodes, MD;  Location: [facility];  Service: Urology;  Laterality: Right;  . INGUINAL HERNIA REPAIR  2003   Right  . [place]   left  . SKIN CANCER EXCISION  2012   Nose  . TONSILLECTOMY       Current Meds  Medication Sig  . acetaminophen (TYLENOL) 325 MG tablet Take 650 mg by mouth every 6 (six) hours as needed for mild pain.  Marland Kitchen HYDROcodone-acetaminophen (NORCO) 10-325 MG tablet Take 1-2 tablets by mouth every 4 (four) hours as needed for moderate pain. Maximum dose per 24 hours - 8 pills  . lisinopril (ZESTRIL) 10 MG tablet Take 10 mg by mouth daily.  . Multiple Vitamin (MULTIVITAMIN) capsule Take 1 capsule by mouth daily.  . rosuvastatin (CRESTOR) 10 MG tablet Take 10 mg by mouth daily.     Allergies:   Patient has no known allergies.   Social History   Tobacco Use  . Smoking status: Former Smoker    Years: 6.00  . Smokeless tobacco: Former Systems developer    Quit date: 02/15/1983  Substance Use Topics  . Alcohol use: Yes    Alcohol/week: 2.0 standard drinks    Types: 2 Glasses of wine per week    Comment: Occasional  . Drug use: No     Family Hx:  The patient's family history includes ALS in his father; Cancer in his maternal grandfather.  ROS:   Please see the history of present illness.    No Fever, chills  or productive cough All other systems reviewed and are negative.  Labs/Other Tests and Data Reviewed:    EKG:  An ECG dated 09/07/18 was personally reviewed today and demonstrated:  Normal sinus rhythm but no ST changes.   Wt Readings from Last 3 Encounters:  09/28/18 195 lb (88.5 kg)  08/09/17 191 lb 11.2 oz (87 kg)  04/13/12 175 lb 12.8 oz (79.7 kg)     Objective:    Vital Signs:  BP 137/81   Pulse 68   Ht 5\' 10"  (1.778 m)   Wt 195 lb (88.5 kg)   BMI 27.98 kg/m    VITAL  SIGNS:  reviewed NAD Answers questions appropriately Normal affect Remainder of physical examination not performed (telehealth visit; coronavirus pandemic)  ASSESSMENT & PLAN:    1. Chest pain-symptoms are atypical.  We will arrange an exercise treadmill for risk stratification. 2. Hypertension-patient recently initiated on lisinopril.  We will follow blood pressure and increased dose as needed. 3. Hyperlipidemia-recently started on Crestor.  We will await follow-up lipids and liver.  COVID-19 Education: The importance of social distancing was discussed today.  Time:   Today, I have spent 22 minutes with the patient with telehealth technology discussing the above problems.     Medication Adjustments/Labs and Tests Ordered: Current medicines are reviewed at length with the patient today.  Concerns regarding medicines are outlined above.   Tests Ordered: No orders of the defined types were placed in this encounter.   Medication Changes: No orders of the defined types were placed in this encounter.   Follow Up:  Virtual Visit or In Person in 6 month(s)  Signed, Kirk Ruths, MD  09/28/2018 3:44 PM    [place]

## 2018-09-28 ENCOUNTER — Telehealth: Payer: Self-pay

## 2018-09-28 ENCOUNTER — Telehealth (INDEPENDENT_AMBULATORY_CARE_PROVIDER_SITE_OTHER): Payer: Managed Care, Other (non HMO) | Admitting: Cardiology

## 2018-09-28 ENCOUNTER — Encounter: Payer: Self-pay | Admitting: Cardiology

## 2018-09-28 ENCOUNTER — Other Ambulatory Visit: Payer: Self-pay

## 2018-09-28 VITALS — BP 137/81 | HR 68 | Ht 70.0 in | Wt 195.0 lb

## 2018-09-28 DIAGNOSIS — E7800 Pure hypercholesterolemia, unspecified: Secondary | ICD-10-CM | POA: Diagnosis not present

## 2018-09-28 DIAGNOSIS — I10 Essential (primary) hypertension: Secondary | ICD-10-CM | POA: Diagnosis not present

## 2018-09-28 DIAGNOSIS — R072 Precordial pain: Secondary | ICD-10-CM | POA: Diagnosis not present

## 2018-09-28 NOTE — Patient Instructions (Signed)
Medication Instructions:  NO CHANGE If you need a refill on your cardiac medications before your next appointment, please call your pharmacy.   Lab work: If you have labs (blood work) drawn today and your tests are completely normal, you will receive your results only by: Marland Kitchen MyChart Message (if you have MyChart) OR . A paper copy in the mail If you have any lab test that is abnormal or we need to change your treatment, we will call you to review the results.  Testing/Procedures: Your physician has requested that you have an exercise tolerance test. For further information please visit [email or web address]. Please also follow instruction sheet, as given.  IN THE [place] OFFICE-WE WILL CALL YOU TO SCHEDULE  Follow-Up: At [facility], you and your health needs are our priority.  As part of our continuing mission to provide you with exceptional heart care, we have created designated Provider Care Teams.  These Care Teams include your primary Cardiologist (physician) and Advanced Practice Providers (APPs -  Physician Assistants and Nurse Practitioners) who all work together to provide you with the care you need, when you need it. You will need a follow up appointment in 6 months.  Please call our office 2 months in advance to schedule this appointment.  You may see Kirk Ruths MD or one of the following Advanced Practice Providers on your designated Care Team:   Kerin Ransom, PA-C Roby Lofts, [place] . Sande Rives, PA-C

## 2018-09-28 NOTE — Telephone Encounter (Signed)
Virtual Visit Pre-Appointment Phone Call  "(Name), I am calling you today to discuss your upcoming appointment. We are currently trying to limit exposure to the virus that causes COVID-19 by seeing patients at home rather than in the office."  1. "What is the BEST phone number to call the day of the visit?" - include this in appointment notes  2. "Do you have or have access to (through a family member/friend) a smartphone with video capability that we can use for your visit?" a. If yes - list this number in appt notes as "cell" (if different from BEST phone #) and list the appointment type as a VIDEO visit in appointment notes b. If no - list the appointment type as a PHONE visit in appointment notes  Confirm consent - "In the setting of the current Covid19 crisis, you are scheduled for a (phone or video) visit with your provider on (date) at (time).  Just as we do with many in-office visits, in order for you to participate in this visit, we must obtain consent.  If you'd like, I can send this to your mychart (if signed up) or email for you to review.  Otherwise, I can obtain your verbal consent now.  All virtual visits are billed to your insurance company just like a normal visit would be.  By agreeing to a virtual visit, we'd like you to understand that the technology does not allow for your provider to perform an examination, and thus may limit your provider's ability to fully assess your condition. If your provider identifies any concerns that need to be evaluated in person, we will make arrangements to do so.  Finally, though the technology is pretty good, we cannot assure that it will always work on either your or our end, and in the setting of a video visit, we may have to convert it to a phone-only visit.  In either situation, we cannot ensure that we have a secure connection.  Are you willing to proceed?" STAFF: Did the patient verbally acknowledge consent to telehealth visit? Document  YES/NO here: YES  3. Advise patient to be prepared - "Two hours prior to your appointment, go ahead and check your blood pressure, pulse, oxygen saturation, and your weight (if you have the equipment to check those) and write them all down. When your visit starts, your provider will ask you for this information. If you have an Apple Watch or Kardia device, please plan to have heart rate information ready on the day of your appointment. Please have a pen and paper handy nearby the day of the visit as well."  4. Give patient instructions for MyChart download to smartphone OR Doximity/[email or web address].me as below if video visit (depending on what platform provider is using)  5. Inform patient they will receive a phone call 15 minutes prior to their appointment time (may be from unknown caller ID) so they should be prepared to answer    TELEPHONE CALL NOTE  Henry Kennedy has been deemed a candidate for a follow-up tele-health visit to limit community exposure during the Covid-19 pandemic. I spoke with the patient via phone to ensure availability of phone/video source, confirm preferred email & phone number, and discuss instructions and expectations.  I reminded Henry Kennedy to be prepared with any vital sign and/or heart rhythm information that could potentially be obtained via home monitoring, at the time of his visit. I reminded Henry Kennedy to expect a phone call prior to his  visit.  Henry Kennedy, [place] 09/28/2018 2:54 PM   INSTRUCTIONS FOR DOWNLOADING THE MYCHART APP TO SMARTPHONE  - The patient must first make sure to have activated MyChart and know their login information - If Apple, go to [employer] and type in MyChart in the search bar and download the app. If Android, ask patient to go to [employer] and type in [place] in the search bar and download the app. The app is free but as with any other app downloads, their phone may require them to verify saved payment information or Apple/Android  password.  - The patient will need to then log into the app with their MyChart username and password, and select [employer] as their healthcare provider to link the account. When it is time for your visit, go to the MyChart app, find appointments, and click Begin Video Visit. Be sure to Select Allow for your device to access the Microphone and Camera for your visit. You will then be connected, and your provider will be with you shortly.  **If they have any issues connecting, or need assistance please contact MyChart service desk [phone])**  **If using a computer, in order to ensure the best quality for their visit they will need to use either of the following Internet Browsers: [employer], or Google Chrome**  IF USING DOXIMITY or DOXY.ME - The patient will receive a link just prior to their visit by text.     FULL LENGTH CONSENT FOR TELE-HEALTH VISIT   I hereby voluntarily request, consent and authorize [facility] and its employed or contracted physicians, physician assistants, nurse practitioners or other licensed health care professionals (the Practitioner), to provide me with telemedicine health care services (the "Services") as deemed necessary by the treating Practitioner. I acknowledge and consent to receive the Services by the Practitioner via telemedicine. I understand that the telemedicine visit will involve communicating with the Practitioner through live audiovisual communication technology and the disclosure of certain medical information by electronic transmission. I acknowledge that I have been given the opportunity to request an in-person assessment or other available alternative prior to the telemedicine visit and am voluntarily participating in the telemedicine visit.  I understand that I have the right to withhold or withdraw my consent to the use of telemedicine in the course of my care at any time, without affecting my right to future care or treatment,  and that the Practitioner or I may terminate the telemedicine visit at any time. I understand that I have the right to inspect all information obtained and/or recorded in the course of the telemedicine visit and may receive copies of available information for a reasonable fee.  I understand that some of the potential risks of receiving the Services via telemedicine include:  Marland Kitchen Delay or interruption in medical evaluation due to technological equipment failure or disruption; . Information transmitted may not be sufficient (e.g. poor resolution of images) to allow for appropriate medical decision making by the Practitioner; and/or  . In rare instances, security protocols could fail, causing a breach of personal health information.  Furthermore, I acknowledge that it is my responsibility to provide information about my medical history, conditions and care that is complete and accurate to the best of my ability. I acknowledge that Practitioner's advice, recommendations, and/or decision may be based on factors not within their control, such as incomplete or inaccurate data provided by me or distortions of diagnostic images or specimens that may result from electronic transmissions. I understand  that the practice of medicine is not an exact science and that Practitioner makes no warranties or guarantees regarding treatment outcomes. I acknowledge that I will receive a copy of this consent concurrently upon execution via email to the email address I last provided but may also request a printed copy by calling the office of [facility].    I understand that my insurance will be billed for this visit.   I have read or had this consent read to me. . I understand the contents of this consent, which adequately explains the benefits and risks of the Services being provided via telemedicine.  . I have been provided ample opportunity to ask questions regarding this consent and the Services and have had my questions  answered to my satisfaction. . I give my informed consent for the services to be provided through the use of telemedicine in my medical care  By participating in this telemedicine visit I agree to the above.

## 2018-10-04 ENCOUNTER — Other Ambulatory Visit: Payer: Self-pay | Admitting: *Deleted

## 2018-10-04 ENCOUNTER — Telehealth: Payer: Self-pay | Admitting: *Deleted

## 2018-10-04 NOTE — Telephone Encounter (Signed)
-----   Message from Caprice Kluver sent at 10/04/2018  9:00 AM EDT ----- Regarding: Order Covid testing for GXT Good morning Astria Jordahl,  There is a GXT scheduled on Thornton Dohrmann a patient of Dr. Stanford Breed on Wednesday 10/12/18.  I will need you to call him and schedule/order Covid testing by Saturday July 11th (M-F hours 9-3:30, Sat hours 9-12:30) and instruct him to self isolate after he has the covid testing performed until his GXT on July 15th.  If he does not have the covid testing performed and if we don't have the testing results back prior to the GXT appointment we will have to cancel the test.  Thanks for your help.  Please let me know if you have any questions.  Thanks, Shirlean Mylar

## 2018-10-04 NOTE — Telephone Encounter (Signed)
Spoke with pt, covid testing scheduled 10-08-2018 @ 10:55 am at [facility]. Patient voiced understanding to self quarantine until GXT 10-12-18.

## 2018-10-08 ENCOUNTER — Other Ambulatory Visit (HOSPITAL_COMMUNITY)
Admission: RE | Admit: 2018-10-08 | Discharge: 2018-10-08 | Disposition: A | Discharge status: Home / Self Care | Payer: Managed Care, Other (non HMO) | Source: Ambulatory Visit | Attending: Cardiology | Admitting: Cardiology

## 2018-10-08 DIAGNOSIS — Z01812 Encounter for preprocedural laboratory examination: Secondary | ICD-10-CM | POA: Insufficient documentation

## 2018-10-08 DIAGNOSIS — Z1159 Encounter for screening for other viral diseases: Secondary | ICD-10-CM | POA: Insufficient documentation

## 2018-10-08 LAB — SARS CORONAVIRUS 2 (TAT 6-24 HRS): SARS Coronavirus 2: NEGATIVE

## 2018-10-12 ENCOUNTER — Other Ambulatory Visit: Payer: Self-pay

## 2018-10-12 ENCOUNTER — Ambulatory Visit (HOSPITAL_COMMUNITY)
Admission: RE | Admit: 2018-10-12 | Discharge: 2018-10-12 | Disposition: A | Discharge status: Home / Self Care | Payer: Managed Care, Other (non HMO) | Source: Ambulatory Visit | Attending: Cardiovascular Disease | Admitting: Cardiovascular Disease

## 2018-10-12 DIAGNOSIS — R072 Precordial pain: Secondary | ICD-10-CM | POA: Insufficient documentation

## 2018-10-12 LAB — EXERCISE TOLERANCE TEST
Estimated workload: 11.8 METS
Exercise duration (min): 10 min
Exercise duration (sec): 5 s
MPHR: 158 {beats}/min
Peak HR: 150 {beats}/min
Percent HR: 94 %
RPE: 15
Rest HR: 60 {beats}/min

## 2019-03-08 ENCOUNTER — Other Ambulatory Visit: Payer: Self-pay

## 2019-03-08 ENCOUNTER — Ambulatory Visit: Payer: Managed Care, Other (non HMO) | Admitting: Neurology

## 2019-03-08 ENCOUNTER — Encounter: Payer: Self-pay | Admitting: Neurology

## 2019-03-08 DIAGNOSIS — G250 Essential tremor: Secondary | ICD-10-CM

## 2019-03-08 HISTORY — DX: Essential tremor: G25.0

## 2019-03-08 MED ORDER — TOPIRAMATE 25 MG PO TABS: 25.0000 mg | ORAL_TABLET | Freq: Two times a day (BID) | ORAL | 3 refills | Status: AC

## 2019-03-08 NOTE — Patient Instructions (Signed)
We will start Topamax for the tremor.  Topamax (topiramate) is a seizure medication that has an FDA approval for seizures and for migraine headache. Potential side effects of this medication include weight loss, cognitive slowing, tingling in the fingers and toes, and carbonated drinks will taste bad. If any significant side effects are noted on this drug, please contact our office.

## 2019-03-08 NOTE — Progress Notes (Signed)
Reason for visit: Tremor  Referring physician: Dr. Osvaldo [name] is a 63 y.o. male  History of present illness:  Mr. Henry Kennedy is a 63 year old right-handed white male with a history of an essential tremor.  The patient denies any family history of tremor, but his father died with ALS in his 33s.  The patient reports a 10-year history of tremor that has gradually worsened with time.  The patient has more difficulty with his right hand than the left, but both hands are affected.  He indicates that handwriting and feeding himself are more difficult, sometimes he has to steady the hand with the other hand.  The patient has been on propranolol in the past that was not well-tolerated.  He was not placed on Mysoline as he likes to have 1 or 2 drinks in the evening and it was felt that this medication was relatively contraindicated.  The patient denies any head or neck tremor or tremor in the voice.  He has not had any changes in balance, he reports no falls.  He is sent to this office for further evaluation.  He drinks on average 2 cups of coffee daily, he reports no other caffeine intake.  Past Medical History:  Diagnosis Date  . Cancer ([place])    skin  Basal  and squamous nose   . Essential tremor   . History of bilateral inguinal hernias   . Hyperlipidemia   . Hypertension   . Other specified forms of tremor   . PONV (postoperative nausea and vomiting)   . Right hydrocele     Past Surgical History:  Procedure Laterality Date  . ANTERIOR CERVICAL DECOMP/DISCECTOMY FUSION  04/15/2011  . COLONOSCOPY    . HERNIA REPAIR  1993 2005  . HYDROCELE EXCISION Right 08/09/2017   Procedure: HYDROCELECTOMY ADULT;  Surgeon: Kathie Rhodes, MD;  Location: [facility]  Service: Urology;  Laterality: Right;  . INGUINAL HERNIA REPAIR  2003   Right  . [place]   left  . MOHS SURGERY    . SKIN CANCER EXCISION  2012   Nose  . TONSILLECTOMY      Family History   Problem Relation Age of Onset  . ALS Father   . Cancer Maternal Grandfather        prostate    Social history:  reports that he has quit smoking. He quit after 6.00 years of use. He quit smokeless tobacco use about 36 years ago. He reports current alcohol use of about 2.0 standard drinks of alcohol per week. He reports that he does not use drugs.  Medications:  Prior to Admission medications   Medication Sig Start Date End Date Taking? Authorizing Provider  lisinopril-hydrochlorothiazide (ZESTORETIC) 10-12.5 MG tablet Take 1 tablet by mouth daily. 03/03/19  Yes [provider]  rosuvastatin (CRESTOR) 10 MG tablet Take 10 mg by mouth daily.   Yes [provider]     No Known Allergies  ROS:  Out of a complete 14 system review of symptoms, the patient complains only of the following symptoms, and all other reviewed systems are negative.  Tremor  Blood pressure 130/82, pulse 73, temperature 97.8 F (36.6 C), temperature source Temporal, height 5\' 10"  (1.778 m), weight 202 lb (91.6 kg), SpO2 96 %.  Physical Exam  General: The patient is alert and cooperative at the time of the examination.  Eyes: Pupils are equal, round, and reactive to light. Discs are flat  bilaterally.  Neck: The neck is supple, no carotid bruits are noted.  Respiratory: The respiratory examination is clear.  Cardiovascular: The cardiovascular examination reveals a regular rate and rhythm, no obvious murmurs or rubs are noted.  Skin: Extremities are without significant edema.  Neurologic Exam  Mental status: The patient is alert and oriented x 3 at the time of the examination. The patient has apparent normal recent and remote memory, with an apparently normal attention span and concentration ability.  Cranial nerves: Facial symmetry is present. There is good sensation of the face to pinprick and soft touch bilaterally. The strength of the facial muscles and the muscles to head turning and  shoulder shrug are normal bilaterally. Speech is well enunciated, no aphasia or dysarthria is noted. Extraocular movements are full. Visual fields are full. The tongue is midline, and the patient has symmetric elevation of the soft palate. No obvious hearing deficits are noted.  Motor: The motor testing reveals 5 over 5 strength of all 4 extremities. Good symmetric motor tone is noted throughout.  Sensory: Sensory testing is intact to pinprick, soft touch, vibration sensation, and position sense on all 4 extremities. No evidence of extinction is noted.  Coordination: Cerebellar testing reveals good finger-nose-finger and heel-to-shin bilaterally.  Minimal tremor is noted with finger-nose-finger bilaterally.  Gait and station: Gait is normal. Tandem gait is normal. Romberg is negative. No drift is seen.  Reflexes: Deep tendon reflexes are symmetric and normal bilaterally. Toes are downgoing bilaterally.   Assessment/Plan:  1.  Mild essential tremor  The patient will be given a trial on low-dose Topamax, he will call for any dose adjustments.  If this is not well-tolerated, gabapentin may be used in the future.  He will follow-up here in 6 months.  Jill Alexanders MD 03/08/2019 10:55 AM  [facility] [address] [place], [place] [address]  Phone [phone] Fax [phone]

## 2019-04-13 ENCOUNTER — Telehealth: Payer: Self-pay | Admitting: *Deleted

## 2019-04-13 NOTE — Telephone Encounter (Signed)
Henry Kennedy stated he's dong fine, will call us back to schedule.

## 2019-09-06 ENCOUNTER — Ambulatory Visit: Payer: Managed Care, Other (non HMO) | Admitting: Family Medicine

## 2024-03-26 NOTE — H&P (Signed)
 TOTAL HIP ADMISSION H&P  Patient is admitted for left total hip arthroplasty.  Therapy Plans: HEP Disposition: Home with wife Planned DVT Prophylaxis: aspirin 81mg  BID DME needed: none PCP: clearance received TXA: IV Allergies: NKDA Anesthesia Concerns: none (hx of multiple disc replacements -- Dr. Onetha) BMI: 30.3 Last HgbA1c: Not diabetic   Other: - SDD - self caths at baseline due to prior neck surgery complications - No hx of VTE or cancer - oxycodone , cyclobenzaprine , tylenol , celebrex  Subjective:  Chief Complaint: left hip pain  HPI: Henry Kennedy, 68 y.o. male, has a history of pain and functional disability in the left hip(s) due to arthritis and patient has failed non-surgical conservative treatments for greater than 12 weeks to include NSAID's and/or analgesics and activity modification.  Onset of symptoms was gradual starting 2 years ago with gradually worsening course since that time.The patient noted no past surgery on the left hip(s).  Patient currently rates pain in the left hip at 8 out of 10 with activity. Patient has worsening of pain with activity and weight bearing. Patient has evidence of joint space narrowing by imaging studies. This condition presents safety issues increasing the risk of falls.   There is no current active infection.  Patient Active Problem List   Diagnosis Date Noted   Tremor, essential 03/08/2019   Urinary retention-chronic, does sics 02/15/2012   Past Medical History:  Diagnosis Date   Cancer ([facility])    skin  Basal  and squamous nose    Essential tremor    History of bilateral inguinal hernias    Hyperlipidemia    Hypertension    Other specified forms of tremor    PONV (postoperative nausea and vomiting)    Right hydrocele    Tremor, essential 03/08/2019    Past Surgical History:  Procedure Laterality Date   ANTERIOR CERVICAL DECOMP/DISCECTOMY FUSION  04/15/2011   COLONOSCOPY     HERNIA REPAIR  1993 2005   HYDROCELE EXCISION  Right 08/09/2017   Procedure: HYDROCELECTOMY ADULT;  Surgeon: Ceil Anes, MD;  Location[facility];  Service: Urology;  Laterality: Right;   INGUINAL HERNIA REPAIR  2003   Right   INGUINAL HERNIA REPAIR  1993   left   MOHS SURGERY     SKIN CANCER EXCISION  2012   Nose   TONSILLECTOMY      No current facility-administered medications for this encounter.   Current Outpatient Medications  Medication Sig Dispense Refill Last Dose/Taking   lisinopril-hydrochlorothiazide (ZESTORETIC) 10-12.5 MG tablet Take 1 tablet by mouth daily.      rosuvastatin (CRESTOR) 10 MG tablet Take 10 mg by mouth daily.      topiramate  (TOPAMAX ) 25 MG tablet Take 1 tablet (25 mg total) by mouth 2 (two) times daily. 60 tablet 3    Allergies[1]  Social History   Tobacco Use   Smoking status: Former    Types: Cigarettes   Smokeless tobacco: Former    Quit date: 02/15/1983  Substance Use Topics   Alcohol  use: Yes    Alcohol /week: 2.0 standard drinks of alcohol     Types: 2 Glasses of wine per week    Comment: Occasional- 1 beer or glass of wine in the evening     Family History  Problem Relation Age of Onset   ALS Father    Cancer Maternal Grandfather        prostate     Review of Systems  Constitutional:  Negative for chills and fever.  Respiratory:  Negative for cough and shortness of breath.   Cardiovascular:  Negative for chest pain.  Gastrointestinal:  Negative for nausea and vomiting.  Musculoskeletal:  Positive for arthralgias.     Objective:  Physical Exam Left hip exam: Based on his presentation and concerns I evaluated his left hip. He had very limited range of motion with reproducible discomfort around the left hip girdle with hip flexion internal rotation to neutral with external rotation to 20 degrees Active hip flexion with external rotation contracture noted  Right hip exam: And more fluid range of motion with mild tightness but no reproducible groin  pain Some tenderness in the posterior aspect of the right hip  Left knee exam: No palpable effusion, warmth or erythema Mild tenderness medially Full knee extension and flexion to 120 degrees   Vital signs in last 24 hours: BP: ()/()  Arterial Line BP: ()/()   Labs:   Estimated body mass index is 28.98 kg/m as calculated from the following:   Height as of 03/08/19: 5' 10 (1.778 m).   Weight as of 03/08/19: 91.6 kg.   Imaging Review Plain radiographs demonstrate severe degenerative joint disease of the left hip(s). The bone quality appears to be adequate for age and reported activity level.      Assessment/Plan:  End stage arthritis, left hip(s)  The patient history, physical examination, clinical judgement of the provider and imaging studies are consistent with end stage degenerative joint disease of the left hip(s) and total hip arthroplasty is deemed medically necessary. The treatment options including medical management, injection therapy, arthroscopy and arthroplasty were discussed at length. The risks and benefits of total hip arthroplasty were presented and reviewed. The risks due to aseptic loosening, infection, stiffness, dislocation/subluxation,  thromboembolic complications and other imponderables were discussed.  The patient acknowledged the explanation, agreed to proceed with the plan and consent was signed. Patient is being admitted for inpatient treatment for surgery, pain control, PT, OT, prophylactic antibiotics, VTE prophylaxis, progressive ambulation and ADL's and discharge planning.The patient is planning to be discharged home.   Rosina Calin, PA-C Orthopedic Surgery [facility] [place] [phone]      [1] No Known Allergies

## 2024-03-28 NOTE — Progress Notes (Addendum)
 Anesthesia Review:  ERE:Duzeyzw Nanci ?  Clearance in Media Tab dated 01/07/2024 from Jenna Steelman,FNP  Cardiologist : none   PPM/ ICD: Device Orders: Rep Notified:  Chest x-ray : EKG : 03/31/24  Echo : Stress test: Cardiac Cath :   Activity level: can do a flight of stairs without difficutly  Sleep Study/ CPAP : none  Fasting Blood Sugar :      / Checks Blood Sugar -- times a day:    Blood Thinner/ Instructions /Last Dose: ASA / Instructions/ Last Dose :    Blood pressure was 156/94 in right arm and in left  arm was 157/99.  PT denies any chest pain, shortness of breath, dizziness, headache or blurred vision.  EKG done .    Called and LVMM for pt at [phone] to check on blood pressure and to remind pt to keep a check on blood pressure.  Asked for call back if pt would like at [phone].     EKG done on pt has MRN but no name.  Called EKG dept and LVMM asking for assistance in regards to why name does not appear on EKG.  Lweft call bck number of [phone].    Pt called and LVMM on 1/5/26in regards to blood pressure readings.  AT home pt states over the weekend average has been 132/74 and this am was 119/66.

## 2024-03-29 NOTE — Patient Instructions (Addendum)
 SURGICAL WAITING ROOM VISITATION  Patients having surgery or a procedure may have no more than 2 support people in the waiting area - these visitors may rotate.    Children ages 55 and under will not be able to visit patients in [facility] under most circumstances.   Visitors with respiratory illnesses are discouraged from visiting and should remain at home.  If the patient needs to stay at the hospital during part of their recovery, the visitor guidelines for inpatient rooms apply. Pre-op nurse will coordinate an appropriate time for 1 support person to accompany patient in pre-op.  This support person may not rotate.    Please refer to the [facility] website for the visitor guidelines for Inpatients (after your surgery is over and you are in a regular room).       Your procedure is scheduled on:  04/04/2024    Report to [facility] Main Entrance    Report to admitting at  0600 AM   Call this number if you have problems the morning of surgery [phone]   Do not eat food :After Midnight.   After Midnight you may have the following liquids until _ 0515_____ AM  DAY OF SURGERY  Water Non-Citrus Juices (without pulp, NO RED-Apple, White grape, White cranberry) Black Coffee (NO MILK/CREAM OR CREAMERS, sugar ok)  Clear Tea (NO MILK/CREAM OR CREAMERS, sugar ok) regular and decaf                             Plain Jell-O (NO RED)                                           Fruit ices (not with fruit pulp, NO RED)                                     Popsicles (NO RED)                                                               Sports drinks like Gatorade (NO RED)                    The day of surgery:  Drink ONE (1) Pre-Surgery Clear Ensure or G2 at 0515 AM the morning of surgery. Drink in one sitting. Do not sip.  This drink was given to you during your hospital  pre-op appointment visit. Nothing else to drink after completing the  Pre-Surgery Clear Ensure or  G2.          If you have questions, please contact your surgeons office.       Oral Hygiene is also important to reduce your risk of infection.                                    Remember - BRUSH YOUR TEETH THE MORNING OF SURGERY WITH YOUR REGULAR TOOTHPASTE  DENTURES WILL BE REMOVED PRIOR TO SURGERY PLEASE DO NOT APPLY  Poly grip OR ADHESIVES!!!   Do NOT smoke after Midnight   Stop all vitamins and herbal supplements 7 days before surgery.   Take these medicines the morning of surgery with A SIP OF WATER:  none    DO NOT TAKE ANY ORAL DIABETIC MEDICATIONS DAY OF YOUR SURGERY  Bring CPAP mask and tubing day of surgery.                              You may not have any metal on your body including hair pins, jewelry, and body piercing             Do not wear make-up, lotions, powders, perfumes/cologne, or deodorant  Do not wear nail polish including gel and S&S, artificial/acrylic nails, or any other type of covering on natural nails including finger and toenails. If you have artificial nails, gel coating, etc. that needs to be removed by a nail salon please have this removed prior to surgery or surgery may need to be canceled/ delayed if the surgeon/ anesthesia feels like they are unable to be safely monitored.   Do not shave  48 hours prior to surgery.               Men may shave face and neck.   Do not bring valuables to the hospital. [facility] IS NOT             RESPONSIBLE   FOR VALUABLES.   Contacts, glasses, dentures or bridgework may not be worn into surgery.   Bring small overnight bag day of surgery.   DO NOT BRING YOUR HOME MEDICATIONS TO THE HOSPITAL. PHARMACY WILL DISPENSE MEDICATIONS LISTED ON YOUR MEDICATION LIST TO YOU DURING YOUR ADMISSION IN THE HOSPITAL!    Patients discharged on the day of surgery will not be allowed to drive home.  Someone NEEDS to stay with you for the first 24 hours after anesthesia.   Special Instructions: Bring a copy of your  healthcare power of attorney and living will documents the day of surgery if you haven't scanned them before.              Please read over the following fact sheets you were given: IF YOU HAVE QUESTIONS ABOUT YOUR PRE-OP INSTRUCTIONS PLEASE CALL [phone].   If you received a COVID test during your pre-op visit  it is requested that you wear a mask when out in public, stay away from anyone that may not be feeling well and notify your surgeon if you develop symptoms. If you test positive for Covid or have been in contact with anyone that has tested positive in the last 10 days please notify you surgeon.      Pre-operative 4 CHG Bath Instructions   You can play a key role in reducing the risk of infection after surgery. Your skin needs to be as free of germs as possible. You can reduce the number of germs on your skin by washing with CHG (chlorhexidine gluconate) soap before surgery. CHG is an antiseptic soap that kills germs and continues to kill germs even after washing.   DO NOT use if you have an allergy to chlorhexidine/CHG or antibacterial soaps. If your skin becomes reddened or irritated, stop using the CHG and notify one of our RNs at [phone].   Please shower with the CHG soap starting 4 days before surgery using the following schedule:     Please keep  in mind the following:  DO NOT shave, including legs and underarms, starting the day of your first shower.   You may shave your face at any point before/day of surgery.  Place clean sheets on your bed the day you start using CHG soap. Use a clean washcloth (not used since being washed) for each shower. DO NOT sleep with pets once you start using the CHG.   CHG Shower Instructions:  If you choose to wash your hair and private area, wash first with your normal shampoo/soap.  After you use shampoo/soap, rinse your hair and body thoroughly to remove shampoo/soap residue.  Turn the water OFF and apply about 3 tablespoons (45 ml) of  CHG soap to a CLEAN washcloth.  Apply CHG soap ONLY FROM YOUR NECK DOWN TO YOUR TOES (washing for 3-5 minutes)  DO NOT use CHG soap on face, private areas, open wounds, or sores.  Pay special attention to the area where your surgery is being performed.  If you are having back surgery, having someone wash your back for you may be helpful. Wait 2 minutes after CHG soap is applied, then you may rinse off the CHG soap.  Pat dry with a clean towel  Put on clean clothes/pajamas   If you choose to wear lotion, please use ONLY the CHG-compatible lotions on the back of this paper.     Additional instructions for the day of surgery: DO NOT APPLY any lotions, deodorants, cologne, or perfumes.   Put on clean/comfortable clothes.  Brush your teeth.  Ask your nurse before applying any prescription medications to the skin.      CHG Compatible Lotions   Aveeno Moisturizing lotion  Cetaphil Moisturizing Cream  Cetaphil Moisturizing Lotion  Clairol Herbal Essence Moisturizing Lotion, Dry Skin  Clairol Herbal Essence Moisturizing Lotion, Extra Dry Skin  Clairol Herbal Essence Moisturizing Lotion, Normal Skin  Curel Age Defying Therapeutic Moisturizing Lotion with Alpha Hydroxy  Curel Extreme Care Body Lotion  Curel Soothing Hands Moisturizing Hand Lotion  Curel Therapeutic Moisturizing Cream, Fragrance-Free  Curel Therapeutic Moisturizing Lotion, Fragrance-Free  Curel Therapeutic Moisturizing Lotion, Original Formula  Eucerin Daily Replenishing Lotion  Eucerin Dry Skin Therapy Plus Alpha Hydroxy Crme  Eucerin Dry Skin Therapy Plus Alpha Hydroxy Lotion  Eucerin Original Crme  Eucerin Original Lotion  Eucerin Plus Crme Eucerin Plus Lotion  Eucerin TriLipid Replenishing Lotion  Keri Anti-Bacterial Hand Lotion  Keri Deep Conditioning Original Lotion Dry Skin Formula Softly Scented  Keri Deep Conditioning Original Lotion, Fragrance Free Sensitive Skin Formula  Keri Lotion Fast Absorbing  Fragrance Free Sensitive Skin Formula  Keri Lotion Fast Absorbing Softly Scented Dry Skin Formula  Keri Original Lotion  Keri Skin Renewal Lotion Keri Silky Smooth Lotion  Keri Silky Smooth Sensitive Skin Lotion  Nivea Body Creamy Conditioning Oil  Nivea Body Extra Enriched Teacher, Adult Education Moisturizing Lotion Nivea Crme  Nivea Skin Firming Lotion  NutraDerm 30 Skin Lotion  NutraDerm Skin Lotion  NutraDerm Therapeutic Skin Cream  NutraDerm Therapeutic Skin Lotion  ProShield Protective Hand Cream  Provon moisturizing lotion

## 2024-03-31 ENCOUNTER — Other Ambulatory Visit: Payer: Self-pay

## 2024-03-31 ENCOUNTER — Encounter (HOSPITAL_COMMUNITY): Payer: Self-pay

## 2024-03-31 ENCOUNTER — Encounter (HOSPITAL_COMMUNITY)
Admission: RE | Admit: 2024-03-31 | Discharge: 2024-03-31 | Disposition: A | Discharge status: Home / Self Care | Source: Ambulatory Visit | Attending: Orthopedic Surgery | Admitting: Orthopedic Surgery

## 2024-03-31 VITALS — BP 156/94 | HR 71 | Temp 98.2°F | Resp 16 | Ht 70.0 in | Wt 220.0 lb

## 2024-03-31 DIAGNOSIS — Z01818 Encounter for other preprocedural examination: Secondary | ICD-10-CM

## 2024-03-31 DIAGNOSIS — Z01812 Encounter for preprocedural laboratory examination: Secondary | ICD-10-CM | POA: Diagnosis not present

## 2024-03-31 DIAGNOSIS — M1612 Unilateral primary osteoarthritis, left hip: Secondary | ICD-10-CM | POA: Diagnosis not present

## 2024-03-31 HISTORY — DX: Unspecified osteoarthritis, unspecified site: M19.90

## 2024-03-31 LAB — SURGICAL PCR SCREEN
MRSA, PCR: NEGATIVE
Staphylococcus aureus: NEGATIVE

## 2024-03-31 LAB — BASIC METABOLIC PANEL WITH GFR
Anion gap: 10 (ref 5–15)
BUN: 16 mg/dL (ref 8–23)
CO2: 23 mmol/L (ref 22–32)
Calcium: 9.1 mg/dL (ref 8.9–10.3)
Chloride: 106 mmol/L (ref 98–111)
Creatinine, Ser: 1.08 mg/dL (ref 0.61–1.24)
GFR, Estimated: >60 mL/min
Glucose, Bld: 112 mg/dL — ABNORMAL HIGH (ref 70–99)
Potassium: 4.1 mmol/L (ref 3.5–5.1)
Sodium: 139 mmol/L (ref 135–145)

## 2024-03-31 LAB — CBC
HCT: 41.6 % (ref 39.0–52.0)
Hemoglobin: 14.2 g/dL (ref 13.0–17.0)
MCH: 29.6 pg (ref 26.0–34.0)
MCHC: 34.1 g/dL (ref 30.0–36.0)
MCV: 86.8 fL (ref 80.0–100.0)
Platelets: 229 K/uL (ref 150–400)
RBC: 4.79 MIL/uL (ref 4.22–5.81)
RDW: 13.2 % (ref 11.5–15.5)
WBC: 4.4 K/uL (ref 4.0–10.5)
nRBC: 0 % (ref 0.0–0.2)

## 2024-04-03 NOTE — H&P (Signed)
 TOTAL HIP ADMISSION H&P  Patient is admitted for left total hip arthroplasty.   Therapy Plans: HEP Disposition: Home with wife Planned DVT Prophylaxis: aspirin  81mg  BID DME needed: none PCP: clearance received TXA: IV Allergies: NKDA Anesthesia Concerns: none (hx of multiple disc replacements -- Dr. Onetha) BMI: 30.3 Last HgbA1c: Not diabetic   Other: - SDD - self caths at baseline due to prior neck surgery complications - No hx of VTE or cancer - oxycodone , cyclobenzaprine , tylenol , celebrex   Subjective:  Chief Complaint: Left hip pain  HPI: Henry Kennedy, 69 y.o. male, has a history of pain and functional disability in the left hip due to arthritis and patient has failed non-surgical conservative treatments for greater than 12 weeks to include NSAID's and/or analgesics and activity modification. Onset of symptoms was gradual, starting 2 years ago with gradual worsening course since that time. The patient noted no prior surgeries on the left hip. Patient currently rates pain in the left hip at 8 out of 10 with activity. Patient has night pain, worsening of pain with activity and weight bearing, and pain with passive range of motion. Patient has evidence of joint space narrowing by imaging studies. This condition presents safety issues increasing the risk of falls. There is no current active infection.  Patient Active Problem List   Diagnosis Date Noted   Tremor, essential 03/08/2019   Urinary retention-chronic, does sics 02/15/2012    Past Medical History:  Diagnosis Date   Arthritis    Cancer (HCC)    skin  Basal  and squamous nose    Essential tremor    History of bilateral inguinal hernias    Hyperlipidemia    Hypertension    Other specified forms of tremor    PONV (postoperative nausea and vomiting)    Right hydrocele    Tremor, essential 03/08/2019    Past Surgical History:  Procedure Laterality Date   ANTERIOR CERVICAL DECOMP/DISCECTOMY FUSION  04/15/2011    COLONOSCOPY     HERNIA REPAIR  1993 2005   HYDROCELE EXCISION Right 08/09/2017   Procedure: HYDROCELECTOMY ADULT;  Surgeon: Ceil Anes, MD;  Location: [facility];  Service: Urology;  Laterality: Right;   INGUINAL HERNIA REPAIR  2003   Right   INGUINAL HERNIA REPAIR  1993   left   MOHS SURGERY     SKIN CANCER EXCISION  2012   Nose   TONSILLECTOMY      Prior to Admission medications  Medication Sig Start Date End Date Taking? Authorizing Provider  diclofenac (VOLTAREN) 75 MG EC tablet Take 75 mg by mouth 2 (two) times daily with a meal.   Yes [provider]  lisinopril -hydrochlorothiazide  (ZESTORETIC ) 10-12.5 MG tablet Take 1 tablet by mouth 3 (three) times a week. 03/03/19  Yes [provider]  Sodium Chloride -Xylitol (XLEAR SINUS CARE SPRAY) SOLN Place 1-2 sprays into the nose daily as needed (congestion).   Yes [provider]  topiramate  (TOPAMAX ) 25 MG tablet Take 1 tablet (25 mg total) by mouth 2 (two) times daily. Patient not taking: Reported on 03/27/2024 03/08/19   Jenel Carlin POUR, MD    Allergies[1]  Social History   Socioeconomic History   Marital status: Married    Spouse name: Not on file   Number of children: 2   Years of education: Not on file   Highest education level: Not on file  Occupational History   Not on file  Tobacco Use   Smoking status: Former  Types: Cigarettes   Smokeless tobacco: Not on file  Vaping Use   Vaping status: Never Used  Substance and Sexual Activity   Alcohol  use: Yes    Alcohol /week: 2.0 standard drinks of alcohol     Types: 2 Glasses of wine per week    Comment: Occasional- 1 beer or glass of wine in the evening    Drug use: No   Sexual activity: Not on file  Other Topics Concern   Not on file  Social History Narrative   Right handed    Caffeine- 2 cups per day    Lives at home with wife [name])     Social Drivers of Health   Tobacco Use: Medium Risk (03/31/2024)    Patient History    Smoking Tobacco Use: Former    Smokeless Tobacco Use: Unknown    Passive Exposure: Not on file  Financial Resource Strain: Patient Declined (11/23/2022)   [facility]   Overall Financial Resource Strain (CARDIA)    Difficulty of Paying Living Expenses: Patient declined  Food Insecurity: Patient Declined (11/23/2022)   Received from [facility]   Epic    Within the past 12 months, you worried that your food would run out before you got the money to buy more.: Patient declined    Within the past 12 months, the food you bought just didn't last and you didn't have money to get more.: Patient declined  Transportation Needs: Patient Declined (11/23/2022)   Received from [facility] - Transportation    Lack of Transportation (Medical): Patient declined    Lack of Transportation (Non-Medical): Patient declined  Physical Activity: Unknown (11/23/2022)   Received from [facility]   Exercise Vital Sign    On average, how many days per week do you engage in moderate to strenuous exercise (like a brisk walk)?: Patient declined    Minutes of Exercise per Session: Not on file  Stress: Patient Declined (11/23/2022)   Received from [facility] - Occupational Stress Questionnaire    Feeling of Stress : Patient declined  Social Connections: Not on file  Intimate Partner Violence: Not At Risk (11/23/2022)   Received from [facility]   HITS    Over the last 12 months how often did your partner physically hurt you?: Never    Over the last 12 months how often did your partner insult you or talk down to you?: Never    Over the last 12 months how often did your partner threaten you with physical harm?: Never    Over the last 12 months how often did your partner scream or curse at you?: Never  Depression (PHQ2-9): Not on file  Alcohol  Screen: Not on file  Housing: Not on file  Utilities: Patient Declined  (11/23/2022)   Received from [facility] Utilities    Threatened with loss of utilities: Patient declined  Health Literacy: Not on file    Tobacco Use: Medium Risk (03/31/2024)   Patient History    Smoking Tobacco Use: Former    Smokeless Tobacco Use: Unknown    Passive Exposure: Not on file   Social History   Substance and Sexual Activity  Alcohol  Use Yes   Alcohol /week: 2.0 standard drinks of alcohol    Types: 2 Glasses of wine per week   Comment: Occasional- 1 beer or glass of wine in the evening     Family History  Problem Relation Age  of Onset   ALS Father    Cancer Maternal Grandfather        prostate    Review Of Systems: Constitutional: Constitutional: no fever, chills, night sweats, or significant weight loss. Cardiovascular: Cardiovascular: no palpitations or chest pain. Respiratory: Respiratory: no cough or shortness of breath and No COPD. Gastrointestinal: Gastrointestinal: no vomiting or nausea. Musculoskeletal: Musculoskeletal: Joint Pain and swelling in Joints. Neurologic: Neurologic: no numbness, tingling, or difficulty with balance.   Objective:  Physical Exam: Left hip exam: Based on his presentation and concerns I evaluated his left hip. He had very limited range of motion with reproducible discomfort around the left hip girdle with hip flexion internal rotation to neutral with external rotation to 20 degrees Active hip flexion with external rotation contracture noted  Right hip exam: And more fluid range of motion with mild tightness but no reproducible groin pain Some tenderness in the posterior aspect of the right hip  Left knee exam: No palpable effusion, warmth or erythema Mild tenderness medially Full knee extension and flexion to 120 degrees  Vital signs in last 24 hours:    Imaging Review Plain radiographs demonstrate severe degenerative joint disease of the left hip. The bone quality appears to be adequate for age and reported  activity level.  Assessment/Plan:  End stage arthritis, left hip  The patient history, physical examination, clinical judgement of the provider and imaging studies are consistent with end stage degenerative joint disease of the left hip and total hip arthroplasty is deemed medically necessary. The treatment options including medical management, injection therapy, arthroscopy and arthroplasty were discussed at length. The risks and benefits of total hip arthroplasty were presented and reviewed. The risks due to aseptic loosening, infection, stiffness, dislocation/subluxation, thromboembolic complications and other imponderables were discussed. The patient acknowledged the explanation, agreed to proceed with the plan and consent was signed. Patient is being admitted for inpatient treatment for surgery, pain control, PT, OT, prophylactic antibiotics, VTE prophylaxis, progressive ambulation and ADLs and discharge planning.The patient is planning to be discharged home.  Rosina Calin, PA-C Orthopedic Surgery [facility] [place] [phone]      [1] No Known Allergies

## 2024-04-03 NOTE — Anesthesia Preprocedure Evaluation (Addendum)
 "                                  Anesthesia Evaluation  Patient identified by MRN, date of birth, ID band Patient awake    Reviewed: Allergy & Precautions, NPO status , Patient's Chart, lab work & pertinent test results  Airway Mallampati: II  TM Distance: >3 FB Neck ROM: Full    Dental no notable dental hx. (+) Teeth Intact, Dental Advisory Given   Pulmonary former smoker   Pulmonary exam normal breath sounds clear to auscultation       Cardiovascular hypertension, (-) angina (-) Past MI Normal cardiovascular exam Rhythm:Regular Rate:Normal     Neuro/Psych negative neurological ROS  negative psych ROS   GI/Hepatic   Endo/Other    Renal/GU Lab Results      Component                Value               Date                             K                        4.1                 03/31/2024                 CREATININE               1.08                03/31/2024                    Musculoskeletal  (+) Arthritis ,    Abdominal   Peds  Hematology Lab Results      Component                Value               Date                      WBC                      4.4                 03/31/2024                HGB                      14.2                03/31/2024                HCT                      41.6                03/31/2024                MCV                      86.8                03/31/2024  PLT                      229                 03/31/2024              Anesthesia Other Findings Basal cell ca  Reproductive/Obstetrics                              Anesthesia Physical Anesthesia Plan  ASA: 2  Anesthesia Plan: Spinal   Post-op Pain Management: Ofirmev  IV (intra-op)*   Induction: Intravenous  PONV Risk Score and Plan: 3 and Treatment may vary due to age or medical condition, Propofol  infusion, Midazolam  and Ondansetron   Airway Management Planned: Nasal Cannula and Natural Airway  Additional  Equipment: None  Intra-op Plan:   Post-operative Plan:   Informed Consent: I have reviewed the patients History and Physical, chart, labs and discussed the procedure including the risks, benefits and alternatives for the proposed anesthesia with the patient or authorized representative who has indicated his/her understanding and acceptance.     Dental advisory given  Plan Discussed with: CRNA and Surgeon  Anesthesia Plan Comments:          Anesthesia Quick Evaluation  "

## 2024-04-04 ENCOUNTER — Ambulatory Visit (HOSPITAL_COMMUNITY): Admitting: Anesthesiology

## 2024-04-04 ENCOUNTER — Encounter (HOSPITAL_COMMUNITY): Payer: Self-pay | Admitting: Orthopedic Surgery

## 2024-04-04 ENCOUNTER — Ambulatory Visit (HOSPITAL_COMMUNITY)

## 2024-04-04 ENCOUNTER — Other Ambulatory Visit: Payer: Self-pay

## 2024-04-04 ENCOUNTER — Encounter (HOSPITAL_COMMUNITY): Admission: RE | Disposition: A | Payer: Self-pay | Source: Home / Self Care | Attending: Orthopedic Surgery

## 2024-04-04 ENCOUNTER — Encounter (HOSPITAL_COMMUNITY): Payer: Self-pay | Admitting: Medical

## 2024-04-04 ENCOUNTER — Observation Stay (HOSPITAL_COMMUNITY)
Admission: RE | Admit: 2024-04-04 | Discharge: 2024-04-05 | Disposition: A | Discharge status: Home / Self Care | Attending: Orthopedic Surgery | Admitting: Orthopedic Surgery

## 2024-04-04 DIAGNOSIS — M1612 Unilateral primary osteoarthritis, left hip: Principal | ICD-10-CM | POA: Insufficient documentation

## 2024-04-04 DIAGNOSIS — Z87891 Personal history of nicotine dependence: Secondary | ICD-10-CM | POA: Insufficient documentation

## 2024-04-04 DIAGNOSIS — Z96649 Presence of unspecified artificial hip joint: Secondary | ICD-10-CM

## 2024-04-04 DIAGNOSIS — Z79899 Other long term (current) drug therapy: Secondary | ICD-10-CM | POA: Insufficient documentation

## 2024-04-04 DIAGNOSIS — Z96642 Presence of left artificial hip joint: Secondary | ICD-10-CM

## 2024-04-04 DIAGNOSIS — M25552 Pain in left hip: Secondary | ICD-10-CM | POA: Diagnosis present

## 2024-04-04 DIAGNOSIS — Z85828 Personal history of other malignant neoplasm of skin: Secondary | ICD-10-CM | POA: Insufficient documentation

## 2024-04-04 DIAGNOSIS — I10 Essential (primary) hypertension: Secondary | ICD-10-CM | POA: Insufficient documentation

## 2024-04-04 DIAGNOSIS — Z01818 Encounter for other preprocedural examination: Secondary | ICD-10-CM

## 2024-04-04 HISTORY — PX: TOTAL HIP ARTHROPLASTY: SHX124

## 2024-04-04 LAB — ABO/RH: ABO/RH(D): O POS

## 2024-04-04 LAB — TYPE AND SCREEN
ABO/RH(D): O POS
Antibody Screen: NEGATIVE

## 2024-04-04 MED ORDER — POVIDONE-IODINE 10 % EX SWAB
2.0000 | Freq: Once | CUTANEOUS | Status: AC
  Administered 2024-04-04: 2 via TOPICAL

## 2024-04-04 MED ORDER — LIDOCAINE HCL (PF) 2 % IJ SOLN
INTRAMUSCULAR | Status: AC
  Filled 2024-04-04: qty 5

## 2024-04-04 MED ORDER — ASPIRIN 81 MG PO CHEW
81.0000 mg | CHEWABLE_TABLET | Freq: Two times a day (BID) | ORAL | Status: DC
  Administered 2024-04-04 – 2024-04-05 (×2): 81 mg via ORAL
  Filled 2024-04-04 (×2): qty 1

## 2024-04-04 MED ORDER — ACETAMINOPHEN 500 MG PO TABS
1000.0000 mg | ORAL_TABLET | Freq: Four times a day (QID) | ORAL | Status: DC
  Administered 2024-04-04 – 2024-04-05 (×4): 1000 mg via ORAL
  Filled 2024-04-04 (×4): qty 2

## 2024-04-04 MED ORDER — SODIUM CHLORIDE (PF) 0.9 % IJ SOLN
INTRAMUSCULAR | Status: DC | PRN
  Administered 2024-04-04: 61 mL

## 2024-04-04 MED ORDER — LISINOPRIL-HYDROCHLOROTHIAZIDE 10-12.5 MG PO TABS: 1.0000 | ORAL_TABLET | ORAL | Status: DC

## 2024-04-04 MED ORDER — HYDROMORPHONE HCL 1 MG/ML IJ SOLN
0.2500 mg | INTRAMUSCULAR | Status: DC | PRN
  Administered 2024-04-04 (×4): 0.5 mg via INTRAVENOUS

## 2024-04-04 MED ORDER — PROPOFOL 1000 MG/100ML IV EMUL
INTRAVENOUS | Status: AC
  Filled 2024-04-04: qty 100

## 2024-04-04 MED ORDER — MIDAZOLAM HCL (PF) 2 MG/2ML IJ SOLN
INTRAMUSCULAR | Status: DC | PRN
  Administered 2024-04-04: 2 mg via INTRAVENOUS

## 2024-04-04 MED ORDER — MENTHOL 3 MG MT LOZG: 1.0000 | LOZENGE | OROMUCOSAL | Status: DC | PRN

## 2024-04-04 MED ORDER — HYDROMORPHONE HCL 1 MG/ML IJ SOLN: 0.5000 mg | INTRAMUSCULAR | Status: DC | PRN

## 2024-04-04 MED ORDER — POLYETHYLENE GLYCOL 3350 17 G PO PACK: 17.0000 g | PACK | Freq: Two times a day (BID) | ORAL | Status: AC

## 2024-04-04 MED ORDER — FENTANYL CITRATE (PF) 100 MCG/2ML IJ SOLN
INTRAMUSCULAR | Status: DC | PRN
  Administered 2024-04-04 (×2): 50 ug via INTRAVENOUS

## 2024-04-04 MED ORDER — ONDANSETRON HCL 4 MG PO TABS: 4.0000 mg | ORAL_TABLET | Freq: Four times a day (QID) | ORAL | Status: DC | PRN

## 2024-04-04 MED ORDER — CHLORHEXIDINE GLUCONATE 0.12 % MT SOLN
15.0000 mL | Freq: Once | OROMUCOSAL | Status: AC
  Administered 2024-04-04: 15 mL via OROMUCOSAL

## 2024-04-04 MED ORDER — SODIUM CHLORIDE (PF) 0.9 % IJ SOLN
INTRAMUSCULAR | Status: AC
  Filled 2024-04-04: qty 30

## 2024-04-04 MED ORDER — PHENYLEPHRINE 80 MCG/ML (10ML) SYRINGE FOR IV PUSH (FOR BLOOD PRESSURE SUPPORT)
PREFILLED_SYRINGE | INTRAVENOUS | Status: DC | PRN
  Administered 2024-04-04: 80 ug via INTRAVENOUS

## 2024-04-04 MED ORDER — METOCLOPRAMIDE HCL 5 MG/ML IJ SOLN
5.0000 mg | Freq: Three times a day (TID) | INTRAMUSCULAR | Status: DC | PRN
  Administered 2024-04-04: 5 mg via INTRAVENOUS
  Filled 2024-04-04: qty 2

## 2024-04-04 MED ORDER — PHENYLEPHRINE HCL (PRESSORS) 10 MG/ML IV SOLN
INTRAVENOUS | Status: AC
  Filled 2024-04-04: qty 1

## 2024-04-04 MED ORDER — SENNA 8.6 MG PO TABS: 1.0000 | ORAL_TABLET | Freq: Every day | ORAL | 0 refills | Status: AC

## 2024-04-04 MED ORDER — CELECOXIB 200 MG PO CAPS: 200.0000 mg | ORAL_CAPSULE | Freq: Two times a day (BID) | ORAL | 0 refills | Status: AC

## 2024-04-04 MED ORDER — OXYCODONE HCL 5 MG PO TABS
ORAL_TABLET | ORAL | Status: AC
  Filled 2024-04-04: qty 2

## 2024-04-04 MED ORDER — SODIUM CHLORIDE 0.9 % IV SOLN: INTRAVENOUS | Status: DC

## 2024-04-04 MED ORDER — CYCLOBENZAPRINE HCL 5 MG PO TABS
5.0000 mg | ORAL_TABLET | Freq: Three times a day (TID) | ORAL | Status: DC | PRN
  Administered 2024-04-05: 5 mg via ORAL
  Filled 2024-04-04: qty 1

## 2024-04-04 MED ORDER — LISINOPRIL 10 MG PO TABS
10.0000 mg | ORAL_TABLET | ORAL | Status: DC
  Administered 2024-04-05: 10 mg via ORAL
  Filled 2024-04-04: qty 1

## 2024-04-04 MED ORDER — CEFAZOLIN SODIUM-DEXTROSE 2-4 GM/100ML-% IV SOLN
2.0000 g | Freq: Four times a day (QID) | INTRAVENOUS | Status: AC
  Administered 2024-04-04 (×2): 2 g via INTRAVENOUS
  Filled 2024-04-04 (×2): qty 100

## 2024-04-04 MED ORDER — FENTANYL CITRATE (PF) 100 MCG/2ML IJ SOLN
INTRAMUSCULAR | Status: AC
  Filled 2024-04-04: qty 2

## 2024-04-04 MED ORDER — 0.9 % SODIUM CHLORIDE (POUR BTL) OPTIME
TOPICAL | Status: DC | PRN
  Administered 2024-04-04: 1000 mL

## 2024-04-04 MED ORDER — ORAL CARE MOUTH RINSE: 15.0000 mL | Freq: Once | OROMUCOSAL | Status: AC

## 2024-04-04 MED ORDER — PROPOFOL 10 MG/ML IV BOLUS
INTRAVENOUS | Status: AC
  Filled 2024-04-04: qty 20

## 2024-04-04 MED ORDER — HYDROCHLOROTHIAZIDE 12.5 MG PO TABS
12.5000 mg | ORAL_TABLET | ORAL | Status: DC
  Administered 2024-04-05: 12.5 mg via ORAL
  Filled 2024-04-04: qty 1

## 2024-04-04 MED ORDER — BUPIVACAINE-EPINEPHRINE (PF) 0.25% -1:200000 IJ SOLN
INTRAMUSCULAR | Status: AC
  Filled 2024-04-04: qty 30

## 2024-04-04 MED ORDER — PHENYLEPHRINE HCL-NACL 20-0.9 MG/250ML-% IV SOLN
INTRAVENOUS | Status: DC | PRN
  Administered 2024-04-04: 50 ug/min via INTRAVENOUS

## 2024-04-04 MED ORDER — OXYCODONE HCL 5 MG PO TABS: 5.0000 mg | ORAL_TABLET | ORAL | 0 refills | Status: AC | PRN

## 2024-04-04 MED ORDER — HYDROMORPHONE HCL 1 MG/ML IJ SOLN
INTRAMUSCULAR | Status: AC
  Filled 2024-04-04: qty 1

## 2024-04-04 MED ORDER — METOCLOPRAMIDE HCL 5 MG PO TABS: 5.0000 mg | ORAL_TABLET | Freq: Three times a day (TID) | ORAL | Status: DC | PRN

## 2024-04-04 MED ORDER — FENTANYL CITRATE (PF) 50 MCG/ML IJ SOSY
PREFILLED_SYRINGE | INTRAMUSCULAR | Status: AC
  Filled 2024-04-04: qty 2

## 2024-04-04 MED ORDER — ACETAMINOPHEN 10 MG/ML IV SOLN
INTRAVENOUS | Status: DC | PRN
  Administered 2024-04-04: 1000 mg via INTRAVENOUS

## 2024-04-04 MED ORDER — ALUM & MAG HYDROXIDE-SIMETH 200-200-20 MG/5ML PO SUSP: 30.0000 mL | ORAL | Status: DC | PRN

## 2024-04-04 MED ORDER — TRANEXAMIC ACID-NACL 1000-0.7 MG/100ML-% IV SOLN
1000.0000 mg | INTRAVENOUS | Status: DC
  Filled 2024-04-04: qty 100

## 2024-04-04 MED ORDER — FENTANYL CITRATE (PF) 50 MCG/ML IJ SOSY
25.0000 ug | PREFILLED_SYRINGE | INTRAMUSCULAR | Status: DC | PRN
  Administered 2024-04-04 (×3): 50 ug via INTRAVENOUS

## 2024-04-04 MED ORDER — OXYCODONE HCL 5 MG PO TABS
10.0000 mg | ORAL_TABLET | ORAL | Status: DC | PRN
  Administered 2024-04-04: 10 mg via ORAL
  Filled 2024-04-04: qty 2

## 2024-04-04 MED ORDER — ASPIRIN 81 MG PO CHEW: 81.0000 mg | CHEWABLE_TABLET | Freq: Two times a day (BID) | ORAL | 0 refills | Status: AC

## 2024-04-04 MED ORDER — SENNA 8.6 MG PO TABS
2.0000 | ORAL_TABLET | Freq: Every day | ORAL | Status: DC
  Administered 2024-04-04: 17.2 mg via ORAL
  Filled 2024-04-04: qty 2

## 2024-04-04 MED ORDER — ONDANSETRON HCL 4 MG/2ML IJ SOLN
INTRAMUSCULAR | Status: DC | PRN
  Administered 2024-04-04: 4 mg via INTRAVENOUS

## 2024-04-04 MED ORDER — LACTATED RINGERS IV SOLN: INTRAVENOUS | Status: DC | PRN

## 2024-04-04 MED ORDER — OXYCODONE HCL 5 MG PO TABS
5.0000 mg | ORAL_TABLET | ORAL | Status: DC | PRN
  Administered 2024-04-04 – 2024-04-05 (×2): 10 mg via ORAL
  Filled 2024-04-04 (×2): qty 2

## 2024-04-04 MED ORDER — MIDAZOLAM HCL 2 MG/2ML IJ SOLN
INTRAMUSCULAR | Status: AC
  Filled 2024-04-04: qty 2

## 2024-04-04 MED ORDER — LACTATED RINGERS IV SOLN: INTRAVENOUS | Status: DC

## 2024-04-04 MED ORDER — DIPHENHYDRAMINE HCL 12.5 MG/5ML PO ELIX: 12.5000 mg | ORAL_SOLUTION | ORAL | Status: DC | PRN

## 2024-04-04 MED ORDER — CYCLOBENZAPRINE HCL 5 MG PO TABS: 5.0000 mg | ORAL_TABLET | Freq: Three times a day (TID) | ORAL | 0 refills | Status: AC | PRN

## 2024-04-04 MED ORDER — TRANEXAMIC ACID-NACL 1000-0.7 MG/100ML-% IV SOLN
1000.0000 mg | Freq: Once | INTRAVENOUS | Status: AC
  Administered 2024-04-04: 1000 mg via INTRAVENOUS
  Filled 2024-04-04: qty 100

## 2024-04-04 MED ORDER — ONDANSETRON HCL 4 MG/2ML IJ SOLN: 4.0000 mg | Freq: Once | INTRAMUSCULAR | Status: DC | PRN

## 2024-04-04 MED ORDER — TRANEXAMIC ACID-NACL 1000-0.7 MG/100ML-% IV SOLN
INTRAVENOUS | Status: DC | PRN
  Administered 2024-04-04: 1000 mg via INTRAVENOUS

## 2024-04-04 MED ORDER — STERILE WATER FOR IRRIGATION IR SOLN
Status: DC | PRN
  Administered 2024-04-04: 1000 mL

## 2024-04-04 MED ORDER — FENTANYL CITRATE (PF) 50 MCG/ML IJ SOSY
PREFILLED_SYRINGE | INTRAMUSCULAR | Status: AC
  Filled 2024-04-04: qty 1

## 2024-04-04 MED ORDER — LACTATED RINGERS IV BOLUS: 500.0000 mL | Freq: Once | INTRAVENOUS | Status: DC

## 2024-04-04 MED ORDER — KETOROLAC TROMETHAMINE 30 MG/ML IJ SOLN
INTRAMUSCULAR | Status: AC
  Filled 2024-04-04: qty 1

## 2024-04-04 MED ORDER — DEXAMETHASONE SOD PHOSPHATE PF 10 MG/ML IJ SOLN: 8.0000 mg | Freq: Once | INTRAMUSCULAR | Status: DC

## 2024-04-04 MED ORDER — PHENOL 1.4 % MT LIQD: 1.0000 | OROMUCOSAL | Status: DC | PRN

## 2024-04-04 MED ORDER — POLYETHYLENE GLYCOL 3350 17 G PO PACK
17.0000 g | PACK | Freq: Two times a day (BID) | ORAL | Status: DC
  Administered 2024-04-04 – 2024-04-05 (×2): 17 g via ORAL
  Filled 2024-04-04 (×2): qty 1

## 2024-04-04 MED ORDER — DEXAMETHASONE SOD PHOSPHATE PF 10 MG/ML IJ SOLN
10.0000 mg | Freq: Once | INTRAMUSCULAR | Status: AC
  Administered 2024-04-05: 10 mg via INTRAVENOUS
  Filled 2024-04-04: qty 1

## 2024-04-04 MED ORDER — ONDANSETRON HCL 4 MG/2ML IJ SOLN
4.0000 mg | Freq: Four times a day (QID) | INTRAMUSCULAR | Status: DC | PRN
  Administered 2024-04-04: 4 mg via INTRAVENOUS
  Filled 2024-04-04: qty 2

## 2024-04-04 MED ORDER — CEFAZOLIN SODIUM-DEXTROSE 2-4 GM/100ML-% IV SOLN
2.0000 g | INTRAVENOUS | Status: AC
  Administered 2024-04-04: 2 g via INTRAVENOUS
  Filled 2024-04-04: qty 100

## 2024-04-04 MED ORDER — ACETAMINOPHEN 10 MG/ML IV SOLN
INTRAVENOUS | Status: AC
  Filled 2024-04-04: qty 100

## 2024-04-04 MED ORDER — ALBUMIN HUMAN 5 % IV SOLN: INTRAVENOUS | Status: DC | PRN

## 2024-04-04 MED ORDER — ACETAMINOPHEN 10 MG/ML IV SOLN: 1000.0000 mg | Freq: Once | INTRAVENOUS | Status: DC | PRN

## 2024-04-04 MED ORDER — DEXAMETHASONE SOD PHOSPHATE PF 10 MG/ML IJ SOLN
INTRAMUSCULAR | Status: DC | PRN
  Administered 2024-04-04: 10 mg via INTRAVENOUS

## 2024-04-04 MED ORDER — PROPOFOL 10 MG/ML IV BOLUS
INTRAVENOUS | Status: DC | PRN
  Administered 2024-04-04: 105 ug/kg/min via INTRAVENOUS

## 2024-04-04 MED ORDER — PHENYLEPHRINE 80 MCG/ML (10ML) SYRINGE FOR IV PUSH (FOR BLOOD PRESSURE SUPPORT)
PREFILLED_SYRINGE | INTRAVENOUS | Status: AC
  Filled 2024-04-04: qty 10

## 2024-04-04 MED ORDER — BISACODYL 10 MG RE SUPP: 10.0000 mg | Freq: Every day | RECTAL | Status: DC | PRN

## 2024-04-04 NOTE — Anesthesia Procedure Notes (Signed)
 Procedure Name: MAC Date/Time: 04/04/2024 8:25 AM  Performed by: Obadiah Reyes BROCKS, CRNAPre-anesthesia Checklist: Patient identified, Emergency Drugs available, Suction available, Patient being monitored and Timeout performed Patient Re-evaluated:Patient Re-evaluated prior to induction Oxygen Delivery Method: Simple face mask Preoxygenation: Pre-oxygenation with 100% oxygen Airway Equipment and Method: Oral airway

## 2024-04-04 NOTE — Discharge Instructions (Signed)

## 2024-04-04 NOTE — Op Note (Signed)
 MEDICAL RECORD NO.: [id number]      FACILITY:  [facility]      PHYSICIAN:  Henry Kennedy  DATE OF BIRTH:  [birth year]     DATE OF PROCEDURE:  04/04/2024                                 OPERATIVE REPORT         PREOPERATIVE DIAGNOSIS: left hip osteoarthritis.      POSTOPERATIVE DIAGNOSIS:  left hip osteoarthritis.      PROCEDURE:  left total hip replacement through an anterior approach   utilizing DePuy THR system, component size 60 mm pinnacle cup, a size 36+4 neutral   Altrex liner, a size 7 Hi Actis stem with a 36+8.5 delta ceramic   ball.      SURGEON:  Henry JONETTA. Kennedy, M.D.      ASSISTANT:  Rosina Calin, PA-C     ANESTHESIA:  Spinal.      SPECIMENS:  None.      COMPLICATIONS:  None.      BLOOD LOSS:  650 cc     DRAINS:  None.      INDICATION OF THE PROCEDURE:  Henry Kennedy is a 69 y.o. male who had   presented to office for evaluation of {left hip pain.  Radiographs revealed   progressive degenerative changes with bone-on-bone   articulation of the  hip joint, including subchondral cystic changes and osteophytes.  The patient had painful limited range of   motion significantly affecting their overall quality of life and function.  The patient was failing to    respond to conservative measures including medications and/or injections and activity modification and at this point was ready   to proceed with more definitive measures.  Consent was obtained for   benefit of pain relief.  Specific risks of infection, DVT, component   failure, dislocation, neurovascular injury, and need for revision surgery were reviewed in the office.     PROCEDURE IN DETAIL:  The patient was brought to operative theater.   Once adequate anesthesia, preoperative antibiotics, 2 gm of Ancef , 1 gm of Tranexamic Acid , and 10 mg of Decadron  were administered, the patient was positioned supine on the [employer] table.  Once the patient was safely positioned with adequate padding  and protection of bony prominences we predraped out the hip, and used fluoroscopy to confirm orientation of the pelvis.      The left hip was then prepped and draped from proximal iliac crest to   mid thigh with a shower curtain technique.      A time-out was performed identifying the patient, planned procedure, and the appropriate extremity.     An incision was then made 2 cm lateral to the   anterior superior iliac spine extending over the orientation of the   tensor fascia lata muscle and sharp dissection was carried down to the   fascia of the muscle.      The fascia was then incised.  The muscle belly was identified and swept   laterally and retractor placed along the superior neck.  Following   cauterization of the circumflex vessels and removing some pericapsular   fat, a second cobra retractor was placed on the inferior neck.  A T-capsulotomy was made along the line of the   superior neck to the trochanteric fossa, then extended proximally and   distally.  Tag sutures were placed and the retractors were then placed   intracapsular.  We then identified the trochanteric fossa and   orientation of my neck cut and then made a neck osteotomy with the femur on traction.  The femoral   head was removed without difficulty or complication.  Traction was let   off and retractors were placed posterior and anterior around the   acetabulum.      The labrum and foveal tissue were debrided.  I began reaming with a 52 mm   reamer and reamed up to 59 mm reamer with good bony bed preparation and a 60 mm  cup was chosen.  The final 60 mm Pinnacle cup was then impacted under fluoroscopy to confirm the depth of penetration and orientation with respect to   Abduction and forward flexion.  A screw was placed into the ilium.  The final   36+4 neutral Altrex liner was impacted with good visualized rim fit.  The cup was positioned anatomically within the acetabular portion of the pelvis.  Significant  peri-acetabular osteophytes were removed with an osteotome.     At this point, the femur was rolled to 100 degrees.  Further capsule was   released off the inferior aspect of the femoral neck.  I then   released the superior capsule proximally.  With the leg in a neutral position the hook was placed laterally   along the femur under the vastus lateralis origin and elevated manually and then held in position using the hook attachment on the bed.  The leg was then extended and adducted with the leg rolled to 100   degrees of external rotation with traction off.  Retractors were placed along the medial calcar and posteriorly over the greater trochanter.  Once the proximal femur was fully   exposed, I used a box osteotome to set orientation.  I then began   broaching with the starting chili pepper broach and passed this by hand and then broached up to 7.  With the 7 broach in place I chose a high offset neck and did several trial reductions.  The offset was appropriate, leg lengths   appeared to be equal best matched with the +8.5 head ball trial confirmed radiographically.   Given these findings, I went ahead and dislocated the hip, repositioned all   retractors and positioned the left hip in the extended and abducted position.  The final 7 Hhi Actis stem was   chosen and it was impacted down to the level of neck cut.  Based on this   and the trial reductions, a final 36+8.5 delta ceramic ball was chosen and   impacted onto a clean and dry trunnion, and the hip was reduced.  The   hip had been irrigated throughout the case again at this point.  I did   reapproximate the superior capsular leaflet to the anterior leaflet   using #1 Vicryl.  The fascia of the   tensor fascia lata muscle was then reapproximated using #1 Vicryl and #0 Stratafix sutures.  The   remaining wound was closed with 2-0 Vicryl and running 4-0 Monocryl.   The hip was cleaned, dried, and dressed sterilely using Dermabond and    Aquacel dressing.  The patient was then brought   to recovery room in stable condition tolerating the procedure well.    PA Patti was present for the entirety of the case involved from   preoperative positioning, perioperative retractor management, general  facilitation of the case, as well as primary wound closure as assistant.            Henry Kennedy, M.D.        04/04/2024 8:36 AM

## 2024-04-04 NOTE — Evaluation (Signed)
 Physical Therapy Evaluation Patient Details Name: Henry Henry MRN: [id number] DOB: [birth year] Today's Date: 04/04/2024  History of Present Illness  69 yo male presents to therapy s/p L THA, anterior approach on 04/04/2024 due to failure of conservative measures. Pt PMH includes but is not limited to: tremor, arthritis, hernia, HLD, HTN, and ACDF.  Clinical Impression    DILON LANK is a 69 y.o. male POD 0 s/p L THA. Patient reports IND with mobility at baseline. Patient is now limited by functional impairments (see PT problem list below) and requires min A for bed mobility and CGA for transfers. Patient was able to ambulate 45 feet with RW and CGA level of assist. Patient instructed in exercise to facilitate ROM and circulation to manage edema.  Patient will benefit from continued skilled PT interventions to address impairments and progress towards PLOF. Acute PT will follow to progress mobility and stair training in preparation for safe discharge home with family support and OPPT services. Pt became nauseated, vomited and light headed at end of therapy session and Bp 140/76 (85 PR) once in recliner.  Nurse aware and provided medication.       If plan is discharge home, recommend the following: A little help with walking and/or transfers;A little help with bathing/dressing/bathroom;Assistance with cooking/housework;Assist for transportation;Help with stairs or ramp for entrance   Can travel by private vehicle        Equipment Recommendations None recommended by PT  Recommendations for Other Services       Functional Status Assessment Patient has had a recent decline in their functional status and demonstrates the ability to make significant improvements in function in a reasonable and predictable amount of time.     Precautions / Restrictions Precautions Precautions: Fall Restrictions Weight Bearing Restrictions Per Provider Order: No      Mobility  Bed Mobility Overal bed  mobility: Needs Assistance Bed Mobility: Supine to Sit     Supine to sit: Min assist, Contact guard, HOB elevated     General bed mobility comments: cues and min A to initiate L LE to EOB and able to complete task with CGA    Transfers Overall transfer level: Needs assistance Equipment used: Rolling walker (2 wheels) Transfers: Sit to/from Stand Sit to Stand: Contact guard assist           General transfer comment: min cues    Ambulation/Gait Ambulation/Gait assistance: Contact guard assist Gait Distance (Feet): 45 Feet Assistive device: Rolling walker (2 wheels) Gait Pattern/deviations: Step-to pattern, Decreased stance time - left, Antalgic, Trunk flexed Gait velocity: decreased     General Gait Details: slight trunk flexion with B UE support at RW to offload L LE in stance phase, min cues for safety and RW management  Stairs            Wheelchair Mobility     Tilt Bed    Modified Rankin (Stroke Patients Only)       Balance Overall balance assessment: Needs assistance Sitting-balance support: Feet supported Sitting balance-Leahy Scale: Good     Standing balance support: Bilateral upper extremity supported, During functional activity, Reliant on assistive device for balance Standing balance-Leahy Scale: Poor                               Pertinent Vitals/Pain Pain Assessment Pain Assessment: 0-10 Pain Score: 3  Pain Location: L hip and LE (hx of back and L knee pain)  Pain Descriptors / Indicators: Aching, Constant, Discomfort, Dull, Grimacing, Operative site guarding Pain Intervention(s): Limited activity within patient's tolerance, Monitored during session, Premedicated before session, Repositioned, Ice applied    Home Living Family/patient expects to be discharged to:: Private residence Living Arrangements: Spouse/significant other Available Help at Discharge: Family Type of Home: House Home Access: Stairs to enter Entrance  Stairs-Rails: Right;Left;Can reach both Entrance Stairs-Number of Steps: 3 Alternate Level Stairs-Number of Steps: flight Home Layout: Two level;Bed/bath upstairs Home Equipment: Agricultural Consultant (2 wheels);Cane - single point      Prior Function Prior Level of Function : Independent/Modified Independent             Mobility Comments: IND no AD for all ADLs, self care tasks and IADLs       Extremity/Trunk Assessment        Lower Extremity Assessment Lower Extremity Assessment: LLE deficits/detail LLE Deficits / Details: ankle DF/.PF 5/5 LLE Sensation: WNL    Cervical / Trunk Assessment Cervical / Trunk Assessment: Other exceptions;Neck Surgery (scoliosis)  Communication   Communication Communication: No apparent difficulties    Cognition Arousal: Alert Behavior During Therapy: WFL for tasks assessed/performed   PT - Cognitive impairments: No apparent impairments                         Following commands: Intact       Cueing       General Comments      Exercises Total Joint Exercises Ankle Circles/Pumps: AROM, Both, 10 reps   Assessment/Plan    PT Assessment Patient needs continued PT services  PT Problem List Decreased strength;Decreased range of motion;Decreased activity tolerance;Decreased balance;Decreased mobility;Decreased coordination;Pain       PT Treatment Interventions DME instruction;Gait training;Stair training;Functional mobility training;Therapeutic activities;Therapeutic exercise;Balance training;Neuromuscular re-education;Patient/family education;Modalities    PT Goals (Current goals can be found in the Care Plan section)  Acute Rehab PT Goals Patient Stated Goal: to be able to address back and knee pain and be more mobile PT Goal Formulation: With patient Time For Goal Achievement: 04/18/24 Potential to Achieve Goals: Good    Frequency 7X/week     Co-evaluation               AM-PAC PT 6 Clicks Mobility   Outcome Measure Help needed turning from your back to your side while in a flat bed without using bedrails?: None Help needed moving from lying on your back to sitting on the side of a flat bed without using bedrails?: A Little Help needed moving to and from a bed to a chair (including a wheelchair)?: A Little Help needed standing up from a chair using your arms (e.g., wheelchair or bedside chair)?: A Little Help needed to walk in hospital room?: A Little Help needed climbing 3-5 steps with a railing? : A Lot 6 Click Score: 18    End of Session Equipment Utilized During Treatment: Gait belt Activity Tolerance: Patient tolerated treatment well Patient left: in chair;with call bell/phone within reach;with family/visitor present Nurse Communication: Mobility status PT Visit Diagnosis: Other abnormalities of gait and mobility (R26.89);Muscle weakness (generalized) (M62.81);Pain;Difficulty in walking, not elsewhere classified (R26.2) Pain - Right/Left: Left Pain - part of body: Hip;Knee;Leg (back)    Time: 8254-8183 PT Time Calculation (min) (ACUTE ONLY): 31 min   Charges:   PT Evaluation $PT Eval Low Complexity: 1 Low PT Treatments $Therapeutic Activity: 8-22 mins PT General Charges $$ ACUTE PT VISIT: 1 Visit  [place], PT Acute Rehab   [place] VEAR Drone 04/04/2024, 7:28 PM

## 2024-04-04 NOTE — Interval H&P Note (Signed)
 History and Physical Interval Note:  04/04/2024 6:58 AM  Henry Kennedy  has presented today for surgery, with the diagnosis of Left hip osteoarthritis.  The various methods of treatment have been discussed with the patient and family. After consideration of risks, benefits and other options for treatment, the patient has consented to  Procedures: ARTHROPLASTY, HIP, TOTAL, ANTERIOR APPROACH (Left) as a surgical intervention.  The patient's history has been reviewed, patient examined, no change in status, stable for surgery.  I have reviewed the patient's chart and labs.  Questions were answered to the patient's satisfaction.     [name]

## 2024-04-04 NOTE — Anesthesia Postprocedure Evaluation (Signed)
"   Anesthesia Post Note  Patient: Henry Kennedy  Procedure(s) Performed: ARTHROPLASTY, HIP, TOTAL, ANTERIOR APPROACH (Left: Hip)     Patient location during evaluation: Nursing Unit Anesthesia Type: Spinal Level of consciousness: oriented and awake and alert Pain management: pain level controlled Vital Signs Assessment: post-procedure vital signs reviewed and stable Respiratory status: spontaneous breathing and respiratory function stable Cardiovascular status: blood pressure returned to baseline and stable Postop Assessment: no headache, no backache, no apparent nausea or vomiting and patient able to bend at knees Anesthetic complications: no   No notable events documented.  Last Vitals:  Vitals:   04/04/24 1315 04/04/24 1330  BP: 114/65 112/60  Pulse: 76 78  Resp: 17 14  Temp:    SpO2: 94% 95%    Last Pain:  Vitals:   04/04/24 1330  TempSrc:   PainSc: Asleep                 [name]      "

## 2024-04-04 NOTE — Anesthesia Procedure Notes (Signed)
 Spinal  Patient location during procedure: OR Reason for block: surgical anesthesia  Staffing Performed: anesthesiologist  Authorized by: Jefm Garnette LABOR, MD   Performed by: Jefm Garnette LABOR, MD  Preanesthetic Checklist Completed: patient identified, IV checked, risks and benefits discussed, surgical consent, monitors and equipment checked, pre-op evaluation and timeout performed Spinal Block Patient position: sitting Prep: DuraPrep and site prepped and draped Patient monitoring: heart rate, cardiac monitor, continuous pulse ox and blood pressure Approach: midline Location: L3-4 Injection technique: single-shot Needle Needle type: Pencan  Needle gauge: 24 G Needle length: 10 cm Assessment Sensory level: T4 Events: CSF return  Additional Notes  Attempt (s). Pt tolerated procedure well.

## 2024-04-04 NOTE — Transfer of Care (Signed)
 Immediate Anesthesia Transfer of Care Note  Patient: Henry Kennedy  Procedure(s) Performed: ARTHROPLASTY, HIP, TOTAL, ANTERIOR APPROACH (Left: Hip)  Patient Location: PACU  Anesthesia Type:MAC and Spinal  Level of Consciousness: sedated and responds to stimulation  Airway & Oxygen Therapy: Patient Spontanous Breathing and Patient connected to nasal cannula oxygen  Post-op Assessment: Report given to RN and Post -op Vital signs reviewed and stable  Post vital signs: Reviewed and stable  Last Vitals:  Vitals Value Taken Time  BP 92/49 04/04/24 10:09  Temp    Pulse 65 04/04/24 10:10  Resp 14 04/04/24 10:10  SpO2 99 % 04/04/24 10:10  Vitals shown include unfiled device data.  Last Pain:  Vitals:   04/04/24 0639  TempSrc:   PainSc: 2          Complications: No notable events documented.

## 2024-04-05 DIAGNOSIS — M1612 Unilateral primary osteoarthritis, left hip: Secondary | ICD-10-CM | POA: Diagnosis not present

## 2024-04-05 LAB — CBC
HCT: 30.8 % — ABNORMAL LOW (ref 39.0–52.0)
Hemoglobin: 10.3 g/dL — ABNORMAL LOW (ref 13.0–17.0)
MCH: 29.4 pg (ref 26.0–34.0)
MCHC: 33.4 g/dL (ref 30.0–36.0)
MCV: 88 fL (ref 80.0–100.0)
Platelets: 200 K/uL (ref 150–400)
RBC: 3.5 MIL/uL — ABNORMAL LOW (ref 4.22–5.81)
RDW: 13.9 % (ref 11.5–15.5)
WBC: 10.9 K/uL — ABNORMAL HIGH (ref 4.0–10.5)
nRBC: 0 % (ref 0.0–0.2)

## 2024-04-05 LAB — BASIC METABOLIC PANEL WITH GFR
Anion gap: 10 (ref 5–15)
BUN: 18 mg/dL (ref 8–23)
CO2: 25 mmol/L (ref 22–32)
Calcium: 8.6 mg/dL — ABNORMAL LOW (ref 8.9–10.3)
Chloride: 101 mmol/L (ref 98–111)
Creatinine, Ser: 1.05 mg/dL (ref 0.61–1.24)
GFR, Estimated: >60 mL/min
Glucose, Bld: 144 mg/dL — ABNORMAL HIGH (ref 70–99)
Potassium: 4.4 mmol/L (ref 3.5–5.1)
Sodium: 136 mmol/L (ref 135–145)

## 2024-04-05 NOTE — Progress Notes (Signed)
 Physical Therapy Treatment Patient Details Name: Henry Kennedy MRN: [id number] DOB: [birth year] Today's Date: 04/05/2024   History of Present Illness 69 yo male presents to therapy s/p L THA, anterior approach on 04/04/2024 due to failure of conservative measures. Pt PMH includes but is not limited to: tremor, arthritis, hernia, HLD, HTN, and ACDF.    PT Comments  Pt ambulated in hallway and practiced safe stair technique with spouse observing.  Pt provided with HEP handout and pt and spouse had no further questions.  Pt feels ready for d/c home today.     If plan is discharge home, recommend the following: A little help with walking and/or transfers;A little help with bathing/dressing/bathroom;Assistance with cooking/housework;Assist for transportation;Help with stairs or ramp for entrance   Can travel by private vehicle        Equipment Recommendations  None recommended by PT    Recommendations for Other Services       Precautions / Restrictions Precautions Precautions: Fall     Mobility  Bed Mobility Overal bed mobility: Needs Assistance Bed Mobility: Supine to Sit     Supine to sit: Contact guard     General bed mobility comments: pt in recliner on arrival    Transfers Overall transfer level: Needs assistance Equipment used: Rolling walker (2 wheels) Transfers: Sit to/from Stand Sit to Stand: Contact guard assist           General transfer comment: verbal cues for positioning and technique    Ambulation/Gait Ambulation/Gait assistance: Contact guard assist Gait Distance (Feet): 200 Feet Assistive device: Rolling walker (2 wheels) Gait Pattern/deviations: Decreased stance time - left, Antalgic, Trunk flexed, Step-through pattern Gait velocity: decreased     General Gait Details: verbal cues for neutral positioning of left LE, RW positioning, step length, posture   Stairs Stairs: Yes Stairs assistance: Contact guard assist Stair Management: Step to  pattern, Forwards, Two rails Number of Stairs: 2 General stair comments: verbal cues for sequence and safety, spouse observed; also verbally discussed flight of stairs with one handrail and cane however pt states he will likely stay on main level for recovery   Wheelchair Mobility     Tilt Bed    Modified Rankin (Stroke Patients Only)       Balance                                            Communication Communication Communication: No apparent difficulties  Cognition Arousal: Alert Behavior During Therapy: WFL for tasks assessed/performed   PT - Cognitive impairments: No apparent impairments                         Following commands: Intact      Cueing    Exercises Total Joint Exercises Ankle Circles/Pumps: AROM, Both, 10 reps Quad Sets: AROM, 10 reps Short Arc Quad: AROM, Left, 10 reps Heel Slides: AAROM, Left, 10 reps Hip ABduction/ADduction: AROM, Left, 10 reps, Standing, Supine Long Arc Quad: AROM, Left, Seated, 10 reps Knee Flexion: AROM, Left, 10 reps, Standing Marching in Standing: AROM, Left, Standing, 10 reps Standing Hip Extension: AROM, Left, Standing, 10 reps    General Comments        Pertinent Vitals/Pain Pain Assessment Pain Assessment: 0-10 Pain Score: 3  Pain Location: L hip Pain Descriptors / Indicators: Aching, Sore Pain Intervention(s): Repositioned, Monitored during  session    Home Living                          Prior Function            PT Goals (current goals can now be found in the care plan section) Progress towards PT goals: Progressing toward goals    Frequency    7X/week      PT Plan      Co-evaluation              AM-PAC PT 6 Clicks Mobility   Outcome Measure  Help needed turning from your back to your side while in a flat bed without using bedrails?: None Help needed moving from lying on your back to sitting on the side of a flat bed without using bedrails?:  A Little Help needed moving to and from a bed to a chair (including a wheelchair)?: A Little Help needed standing up from a chair using your arms (e.g., wheelchair or bedside chair)?: A Little Help needed to walk in hospital room?: A Little Help needed climbing 3-5 steps with a railing? : A Little 6 Click Score: 19    End of Session Equipment Utilized During Treatment: Gait belt Activity Tolerance: Patient tolerated treatment well Patient left: in chair;with call bell/phone within reach;with family/visitor present Nurse Communication: Mobility status PT Visit Diagnosis: Other abnormalities of gait and mobility (R26.89) Pain - Right/Left: Left Pain - part of body: Hip     Time: 1224-1232 PT Time Calculation (min) (ACUTE ONLY): 8 min  Charges:    $Gait Training: 8-22 mins $Therapeutic Exercise: 8-22 mins PT General Charges $$ ACUTE PT VISIT: 1 Visit                     Tari KLEIN, DPT Physical Therapist Acute Rehabilitation Services Office: [phone]    Tari CROME [place] 04/05/2024, 1:13 PM

## 2024-04-05 NOTE — Progress Notes (Signed)
 Physical Therapy Treatment Patient Details Name: Henry Kennedy MRN: [id number] DOB: Jan 06, 1956 Today's Date: 04/05/2024   History of Present Illness 69 yo male presents to therapy s/p L THA, anterior approach on 04/04/2024 due to failure of conservative measures. Pt PMH includes but is not limited to: tremor, arthritis, hernia, HLD, HTN, and ACDF.    PT Comments  Pt ambulated in hallway, practiced safe stair technique and performed LE exercises.  Pt would like PT to return when spouse arrives to review information if she has any questions, however anticipate pt to d/c home later today.    If plan is discharge home, recommend the following: A little help with walking and/or transfers;A little help with bathing/dressing/bathroom;Assistance with cooking/housework;Assist for transportation;Help with stairs or ramp for entrance   Can travel by private vehicle        Equipment Recommendations  None recommended by PT    Recommendations for Other Services       Precautions / Restrictions Precautions Precautions: Fall     Mobility  Bed Mobility Overal bed mobility: Needs Assistance Bed Mobility: Supine to Sit     Supine to sit: Contact guard     General bed mobility comments: verbal cues for self assist of L LE using gait belt    Transfers Overall transfer level: Needs assistance Equipment used: Rolling walker (2 wheels) Transfers: Sit to/from Stand Sit to Stand: Contact guard assist           General transfer comment: verbal cues for positioning and technique    Ambulation/Gait Ambulation/Gait assistance: Contact guard assist Gait Distance (Feet): 160 Feet Assistive device: Rolling walker (2 wheels) Gait Pattern/deviations: Step-to pattern, Decreased stance time - left, Antalgic, Trunk flexed, Step-through pattern Gait velocity: decreased     General Gait Details: verbal cues for sequence, RW positioning, step length, posture   Stairs Stairs: Yes Stairs  assistance: Contact guard assist Stair Management: Step to pattern, Forwards, Two rails Number of Stairs: 2 General stair comments: verbal cues for sequence and safety, performed twice   Wheelchair Mobility     Tilt Bed    Modified Rankin (Stroke Patients Only)       Balance                                            Communication Communication Communication: No apparent difficulties  Cognition Arousal: Alert Behavior During Therapy: WFL for tasks assessed/performed   PT - Cognitive impairments: No apparent impairments                         Following commands: Intact      Cueing    Exercises Total Joint Exercises Ankle Circles/Pumps: AROM, Both, 10 reps Quad Sets: AROM, 10 reps Short Arc Quad: AROM, Left, 10 reps Heel Slides: AAROM, Left, 10 reps Hip ABduction/ADduction: AROM, Left, 10 reps, Standing, Supine Long Arc Quad: AROM, Left, Seated, 10 reps Knee Flexion: AROM, Left, 10 reps, Standing Marching in Standing: AROM, Left, Standing, 10 reps Standing Hip Extension: AROM, Left, Standing, 10 reps    General Comments        Pertinent Vitals/Pain Pain Assessment Pain Assessment: 0-10 Pain Score: 3  Pain Location: L hip Pain Descriptors / Indicators: Aching, Sore Pain Intervention(s): Repositioned, Monitored during session    Home Living  Prior Function            PT Goals (current goals can now be found in the care plan section) Progress towards PT goals: Progressing toward goals    Frequency    7X/week      PT Plan      Co-evaluation              AM-PAC PT 6 Clicks Mobility   Outcome Measure  Help needed turning from your back to your side while in a flat bed without using bedrails?: None Help needed moving from lying on your back to sitting on the side of a flat bed without using bedrails?: A Little Help needed moving to and from a bed to a chair (including a  wheelchair)?: A Little Help needed standing up from a chair using your arms (e.g., wheelchair or bedside chair)?: A Little Help needed to walk in hospital room?: A Little Help needed climbing 3-5 steps with a railing? : A Little 6 Click Score: 19    End of Session Equipment Utilized During Treatment: Gait belt Activity Tolerance: Patient tolerated treatment well Patient left: in chair Nurse Communication: Mobility status PT Visit Diagnosis: Other abnormalities of gait and mobility (R26.89) Pain - Right/Left: Left Pain - part of body: Hip     Time: 9056-8997 PT Time Calculation (min) (ACUTE ONLY): 19 min  Charges:    $Therapeutic Exercise: 8-22 mins PT General Charges $$ ACUTE PT VISIT: 1 Visit                    Tari KLEIN, DPT Physical Therapist Acute Rehabilitation Services Office: [phone]    Tari CROME [place] 04/05/2024, 11:34 AM

## 2024-04-05 NOTE — TOC Transition Note (Signed)
 Transition of Care [facility]) - Discharge Note   Patient Details  Name: Henry Kennedy MRN: [id number] Date of Birth: [birth year]  Transition of [facility]) CM/SW Contact:  NORMAN ASPEN, LCSW Phone Number: 04/05/2024, 9:17 AM   Clinical Narrative:     Met with pt who confirms he has needed DME in the home.  Plan for HEP.  No further IP CM needs.  Final next level of care: Home/Self Care Barriers to Discharge: No Barriers Identified   Patient Goals and CMS Choice Patient states their goals for this hospitalization and ongoing recovery are:: return home          Discharge Placement                       Discharge Plan and Services Additional resources added to the After Visit Summary for                  DME Arranged: N/A DME Agency: NA                  Social Drivers of Health (SDOH) Interventions SDOH Screenings   Food Insecurity: No Food Insecurity (04/04/2024)  Housing: Low Risk (04/04/2024)  Transportation Needs: No Transportation Needs (04/04/2024)  Utilities: Not At Risk (04/04/2024)  Financial Resource Strain: Patient Declined (11/23/2022)   Received from [facility]  Physical Activity: Unknown (11/23/2022)   Received from [facility]  Social Connections: Socially Integrated (04/04/2024)  Stress: Patient Declined (11/23/2022)   Received from [facility]  Tobacco Use: Medium Risk (03/31/2024)     Readmission Risk Interventions     No data to display

## 2024-04-05 NOTE — Progress Notes (Signed)
 Patient ID: Henry Kennedy, male   DOB: 05-11-1955, 69 y.o.   MRN: [id number] Subjective: 1 Day Post-Op Procedures (LRB): ARTHROPLASTY, HIP, TOTAL, ANTERIOR APPROACH (Left)    Patient reports pain as mild this morning. He was planning on going home yesterday but his pain limited post op PT so he stayed the night.  Feeling better this morning.  Objective:   VITALS:   Vitals:   04/05/24 0131 04/05/24 0611  BP: 110/62 118/63  Pulse: 87 91  Resp: 17 16  Temp: 98.2 F (36.8 C) 98.4 F (36.9 C)  SpO2: 96% 97%    Neurovascular intact Incision: dressing C/D/I  LABS Recent Labs    04/05/24 0325  HGB 10.3*  HCT 30.8*  WBC 10.9*  PLT 200    Recent Labs    04/05/24 0325  NA 136  K 4.4  BUN 18  CREATININE 1.05  GLUCOSE 144*    No results for input(s): LABPT, INR in the last 72 hours.   Assessment/Plan: 1 Day Post-Op Procedures (LRB): ARTHROPLASTY, HIP, TOTAL, ANTERIOR APPROACH (Left)   Advance diet Up with therapy Home today after therapy Should have med Rx sent to pharmacy already  RTC in 2 weeks

## 2024-04-06 ENCOUNTER — Encounter (HOSPITAL_COMMUNITY): Payer: Self-pay | Admitting: Orthopedic Surgery

## 2024-04-13 NOTE — Discharge Summary (Signed)
 Patient ID: Henry Kennedy MRN: [id number] DOB/AGE: 68-Mar-1957 69 y.o.  Admit date: 04/04/2024 Discharge date: 04/05/2024  Admission Diagnoses:  Left hip osteoarthritis  Discharge Diagnoses:  Principal Problem:   S/P total hip arthroplasty Active Problems:   S/P total left hip arthroplasty   Past Medical History:  Diagnosis Date   Arthritis    Cancer (HCC)    skin  Basal  and squamous nose    Essential tremor    History of bilateral inguinal hernias    Hyperlipidemia    Hypertension    Other specified forms of tremor    PONV (postoperative nausea and vomiting)    Right hydrocele    Tremor, essential 03/08/2019    Surgeries: Procedures: ARTHROPLASTY, HIP, TOTAL, ANTERIOR APPROACH on 04/04/2024   Consultants:   Discharged Condition: Improved  Hospital Course: Henry Kennedy is an 69 y.o. male who was admitted 04/04/2024 for operative treatment ofS/P total hip arthroplasty. Patient has severe unremitting pain that affects sleep, daily activities, and work/hobbies. After pre-op clearance the patient was taken to the operating room on 04/04/2024 and underwent  Procedures: ARTHROPLASTY, HIP, TOTAL, ANTERIOR APPROACH.    Patient was given perioperative antibiotics:  Anti-infectives (From admission, onward)    Start     Dose/Rate Route Frequency Ordered Stop   04/04/24 1800  ceFAZolin  (ANCEF ) IVPB 2g/100 mL premix        2 g 200 mL/hr over 30 Minutes Intravenous Every 6 hours 04/04/24 1700 04/05/24 1242   04/04/24 0630  ceFAZolin  (ANCEF ) IVPB 2g/100 mL premix        2 g 200 mL/hr over 30 Minutes Intravenous On call to O.R. 04/04/24 [id number] 04/04/24 0847        Patient was given sequential compression devices, early ambulation, and chemoprophylaxis to prevent DVT. Patient worked with PT and was meeting their goals regarding safe ambulation and transfers.  Patient benefited maximally from hospital stay and there were no complications.    Recent vital signs: No data found.    Recent laboratory studies: No results for input(s): WBC, HGB, HCT, PLT, NA, K, CL, CO2, BUN, CREATININE, GLUCOSE, INR, CALCIUM in the last 72 hours.  Invalid input(s): PT, 2   Discharge Medications:   Allergies as of 04/05/2024   No Known Allergies      Medication List     STOP taking these medications    diclofenac 75 MG EC tablet Commonly known as: VOLTAREN       TAKE these medications    aspirin  81 MG chewable tablet Commonly known as: Aspirin  Childrens Chew 1 tablet (81 mg total) by mouth in the morning and at bedtime for 28 days.   celecoxib  200 MG capsule Commonly known as: CeleBREX  Take 1 capsule (200 mg total) by mouth 2 (two) times daily.   cyclobenzaprine  5 MG tablet Commonly known as: FLEXERIL  Take 1-2 tablets (5-10 mg total) by mouth 3 (three) times daily as needed for muscle spasms.   lisinopril -hydrochlorothiazide  10-12.5 MG tablet Commonly known as: ZESTORETIC  Take 1 tablet by mouth 3 (three) times a week.   oxyCODONE  5 MG immediate release tablet Commonly known as: Roxicodone  Take 1 tablet (5 mg total) by mouth every 4 (four) hours as needed for severe pain (pain score 7-10).   polyethylene glycol 17 g packet Commonly known as: MIRALAX  / GLYCOLAX  Take 17 g by mouth 2 (two) times daily.   senna 8.6 MG Tabs tablet Commonly known as: SENOKOT Take 1 tablet (8.6 mg total) by mouth  at bedtime for 14 days.   topiramate  25 MG tablet Commonly known as: TOPAMAX  Take 1 tablet (25 mg total) by mouth 2 (two) times daily.   Xlear Sinus Care Spray Soln Place 1-2 sprays into the nose daily as needed (congestion).               Discharge Care Instructions  (From admission, onward)           Start     Ordered   04/05/24 0000  Change dressing       Comments: Maintain surgical dressing until follow up in the clinic. If the edges start to pull up, may reinforce with tape. If the dressing is no longer working, may  remove and cover with gauze and tape, but must keep the area dry and clean.  Call with any questions or concerns.   04/05/24 0746   04/04/24 0000  Change dressing       Comments: Maintain surgical dressing until follow up in the clinic. If the edges start to pull up, may reinforce with tape. If the dressing is no longer working, may remove and cover with gauze and tape, but must keep the area dry and clean.  Call with any questions or concerns.   04/04/24 1219            Diagnostic Studies: DG Pelvis Portable Result Date: 04/04/2024 CLINICAL DATA:  Left total hip prosthesis placement. EXAM: PORTABLE PELVIS 1-2 VIEWS COMPARISON:  C-arm images obtained earlier today. FINDINGS: Left total hip prosthesis in satisfactory position and alignment. No fracture or dislocation seen. Lower lumbar spine degenerative changes. IMPRESSION: Satisfactory postoperative appearance of a left total hip prosthesis. Electronically Signed   By: Elspeth Bathe M.D.   On: 04/04/2024 16:08   DG HIP UNILAT WITH PELVIS 1V LEFT Result Date: 04/04/2024 CLINICAL DATA:  Left total hip arthroplasty. EXAM: DG HIP (WITH OR WITHOUT PELVIS) 1V*L* COMPARISON:  None Available. FINDINGS: Two C-arm images of the left hip and inferior pelvis demonstrate placement of the femoral and acetabular components of a left total hip prosthesis with the exception of the femoral head component. These are in satisfactory position and alignment with no fracture or dislocation seen. IMPRESSION: Satisfactory appearance of a partially placed left total hip prosthesis. Electronically Signed   By: Elspeth Bathe M.D.   On: 04/04/2024 16:07   DG C-Arm 1-60 Min-No Report Result Date: 04/04/2024 Fluoroscopy was utilized by the requesting physician.  No radiographic interpretation.   DG C-Arm 1-60 Min-No Report Result Date: 04/04/2024 Fluoroscopy was utilized by the requesting physician.  No radiographic interpretation.    Disposition: Discharge disposition:  01-Home or Self Care       Discharge Instructions     Call MD / Call 911   Complete by: As directed    If you experience chest pain or shortness of breath, CALL 911 and be transported to the hospital emergency room.  If you develope a fever above 101 F, pus (white drainage) or increased drainage or redness at the wound, or calf pain, call your surgeon's office.   Call MD / Call 911   Complete by: As directed    If you experience chest pain or shortness of breath, CALL 911 and be transported to the hospital emergency room.  If you develope a fever above 101 F, pus (white drainage) or increased drainage or redness at the wound, or calf pain, call your surgeon's office.   Change dressing   Complete by: As directed  Maintain surgical dressing until follow up in the clinic. If the edges start to pull up, may reinforce with tape. If the dressing is no longer working, may remove and cover with gauze and tape, but must keep the area dry and clean.  Call with any questions or concerns.   Change dressing   Complete by: As directed    Maintain surgical dressing until follow up in the clinic. If the edges start to pull up, may reinforce with tape. If the dressing is no longer working, may remove and cover with gauze and tape, but must keep the area dry and clean.  Call with any questions or concerns.   Constipation Prevention   Complete by: As directed    Drink plenty of fluids.  Prune juice may be helpful.  You may use a stool softener, such as Colace (over the counter) 100 mg twice a day.  Use MiraLax  (over the counter) for constipation as needed.   Constipation Prevention   Complete by: As directed    Drink plenty of fluids.  Prune juice may be helpful.  You may use a stool softener, such as Colace (over the counter) 100 mg twice a day.  Use MiraLax  (over the counter) for constipation as needed.   Diet - low sodium heart healthy   Complete by: As directed    Diet - low sodium heart healthy    Complete by: As directed    Increase activity slowly as tolerated   Complete by: As directed    Weight bearing as tolerated with assist device (walker, cane, etc) as directed, use it as long as suggested by your surgeon or therapist, typically at least 4-6 weeks.   Increase activity slowly as tolerated   Complete by: As directed    Weight bearing as tolerated with assist device (walker, cane, etc) as directed, use it as long as suggested by your surgeon or therapist, typically at least 4-6 weeks.   Post-operative opioid taper instructions:   Complete by: As directed    POST-OPERATIVE OPIOID TAPER INSTRUCTIONS: It is important to wean off of your opioid medication as soon as possible. If you do not need pain medication after your surgery it is ok to stop day one. Opioids include: Codeine, Hydrocodone (Norco, Vicodin), Oxycodone (Percocet, oxycontin ) and hydromorphone  amongst others.  Long term and even short term use of opiods can cause: Increased pain response Dependence Constipation Depression Respiratory depression And more.  Withdrawal symptoms can include Flu like symptoms Nausea, vomiting And more Techniques to manage these symptoms Hydrate well Eat regular healthy meals Stay active Use relaxation techniques(deep breathing, meditating, yoga) Do Not substitute Alcohol  to help with tapering If you have been on opioids for less than two weeks and do not have pain than it is ok to stop all together.  Plan to wean off of opioids This plan should start within one week post op of your joint replacement. Maintain the same interval or time between taking each dose and first decrease the dose.  Cut the total daily intake of opioids by one tablet each day Next start to increase the time between doses. The last dose that should be eliminated is the evening dose.      Post-operative opioid taper instructions:   Complete by: As directed    POST-OPERATIVE OPIOID TAPER INSTRUCTIONS: It  is important to wean off of your opioid medication as soon as possible. If you do not need pain medication after your surgery it is ok to stop day  one. Opioids include: Codeine, Hydrocodone (Norco, Vicodin), Oxycodone (Percocet, oxycontin ) and hydromorphone  amongst others.  Long term and even short term use of opiods can cause: Increased pain response Dependence Constipation Depression Respiratory depression And more.  Withdrawal symptoms can include Flu like symptoms Nausea, vomiting And more Techniques to manage these symptoms Hydrate well Eat regular healthy meals Stay active Use relaxation techniques(deep breathing, meditating, yoga) Do Not substitute Alcohol  to help with tapering If you have been on opioids for less than two weeks and do not have pain than it is ok to stop all together.  Plan to wean off of opioids This plan should start within one week post op of your joint replacement. Maintain the same interval or time between taking each dose and first decrease the dose.  Cut the total daily intake of opioids by one tablet each day Next start to increase the time between doses. The last dose that should be eliminated is the evening dose.      TED hose   Complete by: As directed    Use stockings (TED hose) for 2 weeks on both leg(s).  You may remove them at night for sleeping.   TED hose   Complete by: As directed    Use stockings (TED hose) for 2 weeks on both leg(s).  You may remove them at night for sleeping.        Follow-up Information     Ernie Cough, MD. Schedule an appointment as soon as possible for a visit in 2 week(s).   Specialty: Orthopedic Surgery Contact information: [address] [place] [address] [phone]                  Signed: Rosina JONELLE Calin 04/13/2024, 7:00 AM
